# Patient Record
Sex: Male | Born: 1975 | Race: Black or African American | Hispanic: No | Marital: Married | Smoking: Never smoker
Health system: Southern US, Community
[De-identification: ages and names within clinical notes are randomized; demographics above are authoritative.]

## PROBLEM LIST (undated history)

## (undated) DIAGNOSIS — F909 Attention-deficit hyperactivity disorder, unspecified type: Secondary | ICD-10-CM

## (undated) DIAGNOSIS — R42 Dizziness and giddiness: Secondary | ICD-10-CM

## (undated) HISTORY — DX: Dizziness and giddiness: R42

## (undated) HISTORY — DX: Attention-deficit hyperactivity disorder, unspecified type: F90.9

---

## 2003-12-22 ENCOUNTER — Emergency Department (HOSPITAL_COMMUNITY): Admission: EM | Admit: 2003-12-22 | Discharge: 2003-12-22 | Payer: Self-pay | Admitting: Emergency Medicine

## 2007-09-09 ENCOUNTER — Ambulatory Visit: Payer: Self-pay | Admitting: Family Medicine

## 2007-09-09 DIAGNOSIS — H9190 Unspecified hearing loss, unspecified ear: Secondary | ICD-10-CM | POA: Insufficient documentation

## 2007-09-10 ENCOUNTER — Encounter: Payer: Self-pay | Admitting: Family Medicine

## 2007-09-10 LAB — CONVERTED CEMR LAB
ALT: 15 units/L (ref 0–53)
AST: 18 units/L (ref 0–37)
BUN: 15 mg/dL (ref 6–23)
Calcium: 9.6 mg/dL (ref 8.4–10.5)
Chloride: 104 meq/L (ref 96–112)
Cholesterol: 148 mg/dL (ref 0–200)
Creatinine, Ser: 0.96 mg/dL (ref 0.40–1.50)
HDL: 47 mg/dL (ref >39)
TSH: 1.85 microintl units/mL (ref 0.350–5.50)
Total Bilirubin: 0.6 mg/dL (ref 0.3–1.2)
Total CHOL/HDL Ratio: 3.1
VLDL: 9 mg/dL (ref 0–40)

## 2008-07-01 ENCOUNTER — Encounter: Admission: RE | Admit: 2008-07-01 | Discharge: 2008-07-01 | Payer: Self-pay | Admitting: Family Medicine

## 2008-07-01 ENCOUNTER — Ambulatory Visit: Payer: Self-pay | Admitting: Family Medicine

## 2008-07-05 ENCOUNTER — Encounter: Payer: Self-pay | Admitting: Family Medicine

## 2008-07-09 ENCOUNTER — Encounter: Payer: Self-pay | Admitting: Family Medicine

## 2008-07-09 DIAGNOSIS — F909 Attention-deficit hyperactivity disorder, unspecified type: Secondary | ICD-10-CM

## 2008-07-09 HISTORY — DX: Attention-deficit hyperactivity disorder, unspecified type: F90.9

## 2008-07-13 ENCOUNTER — Telehealth: Payer: Self-pay | Admitting: Family Medicine

## 2008-09-14 ENCOUNTER — Telehealth: Payer: Self-pay | Admitting: Family Medicine

## 2008-12-01 ENCOUNTER — Telehealth: Payer: Self-pay | Admitting: Family Medicine

## 2008-12-03 ENCOUNTER — Ambulatory Visit: Payer: Self-pay | Admitting: Family Medicine

## 2009-12-08 ENCOUNTER — Telehealth: Payer: Self-pay | Admitting: Family Medicine

## 2010-05-23 ENCOUNTER — Telehealth: Payer: Self-pay | Admitting: Family Medicine

## 2010-07-14 ENCOUNTER — Ambulatory Visit: Payer: Self-pay | Admitting: Family Medicine

## 2010-07-14 DIAGNOSIS — R42 Dizziness and giddiness: Secondary | ICD-10-CM

## 2010-07-14 HISTORY — DX: Dizziness and giddiness: R42

## 2010-11-13 LAB — CONVERTED CEMR LAB
ALT: 13 units/L (ref 0–53)
AST: 22 units/L (ref 0–37)
Creatinine, Ser: 0.93 mg/dL (ref 0.40–1.50)
HCT: 44.9 % (ref 39.0–52.0)
LDL Cholesterol: 102 mg/dL — ABNORMAL HIGH (ref 0–99)
MCV: 87.2 fL (ref 78.0–100.0)
Platelets: 254 10*3/uL (ref 150–400)
RDW: 14.7 % (ref 11.5–15.5)
Sodium: 138 meq/L (ref 135–145)
TSH: 1.527 microintl units/mL (ref 0.350–4.500)
Total Bilirubin: 1.3 mg/dL — ABNORMAL HIGH (ref 0.3–1.2)
Total CHOL/HDL Ratio: 3.5
Total Protein: 7.1 g/dL (ref 6.0–8.3)
VLDL: 9 mg/dL (ref 0–40)

## 2010-11-15 NOTE — Progress Notes (Signed)
Summary: Aderral refill  Phone Note Refill Request Message from:  Patient on December 08, 2009 1:20 PM  Refills Requested: Medication #1:  ADDERALL XR 15 MG XR24H-CAP Take 1 tablet by mouth once a day 30.   Supply Requested: 1 month  Method Requested: Pick up at Office Initial call taken by: Kathlene November,  December 08, 2009 1:20 PM    Prescriptions: ADDERALL XR 15 MG XR24H-CAP (AMPHETAMINE-DEXTROAMPHETAMINE) Take 1 tablet by mouth once a day 30  #90 x 0   Entered and Authorized by:   Nani Gasser MD   Signed by:   Nani Gasser MD on 12/08/2009   Method used:   Print then Give to Patient   RxID:   [id number]

## 2010-11-15 NOTE — Progress Notes (Signed)
Summary: refill  Phone Note Refill Request Message from:  Patient on May 23, 2010 11:15 AM  Refills Requested: Medication #1:  ADDERALL XR 15 MG XR24H-CAP Take 1 tablet by mouth once a day 30.   Supply Requested: 1 month call [phone] when ready   Method Requested: Pick up at Office Initial call taken by: Kathlene November,  May 23, 2010 11:15 AM    Prescriptions: ADDERALL XR 15 MG XR24H-CAP (AMPHETAMINE-DEXTROAMPHETAMINE) Take 1 tablet by mouth once a day 30  #90 x 0   Entered and Authorized by:   Nani Gasser MD   Signed by:   Nani Gasser MD on 05/23/2010   Method used:   Print then Give to Patient   RxID:   [id number]

## 2010-11-15 NOTE — Assessment & Plan Note (Signed)
Summary: CPE   Vital Signs:  Patient profile:   35 year old male Height:      72.0 inches Weight:      204 pounds BMI:     27.77 Pulse rate:   63 / minute BP sitting:   114 / 68  (right arm) Cuff size:   large  Vitals Entered By: Avon Gully CMA, Duncan Dull) (July 14, 2010 8:27 AM) CC: CPE   Primary Care Provider:  Linford Arnold, C  CC:  CPE.  History of Present Illness: Occ dizzy spell for about 5 min. Will feel weak with it. No aggrevating factors.  Can happen sitting.  No vision or hearing changes with it. Started about 2-3 months ago.  Has been working much longer hours at work in the heat.  Feel staying hydrated. Getting 6-7 hours a night. No palps or CP with the episdoes.  No aggrevating sxs. Describes as more pre-syncopal episodes. No true syncope. Happens about once a week. No known triggers.  They havent change in frequency or duration or intensity.    Current Medications (verified): 1)  Adderall Xr 15 Mg Xr24h-Cap (Amphetamine-Dextroamphetamine) .... Take 1 Tablet By Mouth Once A Day 30  Allergies (verified): No Known Drug Allergies  Comments:  Nurse/Medical Assistant: The patient's medications and allergies were reviewed with the patient and were updated in the Medication and Allergy Lists. 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 Married to [employer] with 3 children.  Luanna Salk his oldest is also a pt here.  Never Smoked Alcohol use-yes Drug use-no Regular exercise-yes  Review of Systems  The patient denies anorexia, fever, weight loss, weight gain, vision loss, decreased hearing, hoarseness, chest pain, syncope, dyspnea on  exertion, peripheral edema, prolonged cough, headaches, hemoptysis, abdominal pain, melena, hematochezia, severe indigestion/heartburn, hematuria, incontinence, genital sores, muscle weakness, suspicious skin lesions, transient blindness, difficulty walking, depression, unusual weight change, abnormal bleeding, and enlarged lymph nodes.    Physical Exam  General:  Well-developed,well-nourished,in no acute distress; alert,appropriate and cooperative throughout examination Head:  Normocephalic and atraumatic without obvious abnormalities. No apparent alopecia or balding. Eyes:  No corneal or conjunctival inflammation noted. EOMI. Perrla.  Ears:  External ear exam shows no significant lesions or deformities.  Otoscopic examination reveals clear canals, tympanic membranes are intact bilaterally without bulging, retraction, inflammation or discharge. Hearing is grossly normal bilaterally. Nose:  External nasal examination shows no deformity or inflammation.  Mouth:  Oral mucosa and oropharynx without lesions or exudates.  Teeth in good repair. Neck:  No deformities, masses, or tenderness noted. Chest Wall:  No deformities, masses, tenderness or gynecomastia noted. Lungs:  Normal respiratory effort, chest expands symmetrically. Lungs are clear to auscultation, no crackles or wheezes. Heart:  Normal rate and regular rhythm. S1 and S2 normal without gallop, murmur, click, rub or other extra sounds. Abdomen:  Bowel sounds positive,abdomen soft and non-tender without masses, organomegaly or hernias noted. Msk:  No deformity or scoliosis noted of thoracic or lumbar spine.   Pulses:  R and L carotid,radial,dorsalis pedis and posterior tibial pulses are full and equal bilaterally Extremities:  No clubbing, cyanosis, edema, or deformity noted with normal full range of motion of  all joints.   Neurologic:  No cranial nerve deficits noted. Station and gait are normal. DTRs are symmetrical throughout. Sensory, motor  and coordinative functions appear intact. Skin:  no rashes.   Cervical Nodes:  No lymphadenopathy noted Psych:  Cognition and judgment appear intact. Alert and cooperative with normal attention span and concentration. No apparent delusions, illusions, hallucinations   Impression & Recommendations:  Problem # 1:  PREVENTIVE HEALTH CARE (ICD-V70.0)  Exam is normal today Keep up the exercise Work on improving sleep duration Make sure eating healthy diet. We will call you next week with lab results.   Orders: T-Comprehensive Metabolic Panel [phone]) T-Lipid Profile [phone])  Problem # 2:  DIZZINESS (ICD-780.4)  Discussed no red flag sxs. No CP, SOB, or other neurological sxs. May just be a combo of working long hours, feeling stressed at work, and not getting enough sleep.  EKG today shows rate of 61 bpm, no acute changes, NSR.   Orders: EKG w/ Interpretation (93000) T-TSH ([id number]) T-CBC No Diff ([id number])  Complete Medication List: 1)  Adderall Xr 15 Mg Xr24h-cap (Amphetamine-dextroamphetamine) .... Take 1 tablet by mouth once a day 30  Other Orders: Flu Vaccine 4yrs + (44010) Admin 1st Vaccine (27253)  Patient Instructions: 1)  Exam is normal today 2)  Keep up the exercise 3)  Work on improving sleep duration 4)  Make sure eating healthy diet. 5)  We will call you next week with lab results.  6)  You were given a flu shot today.    Immunizations Administered:  Influenza Vaccine # 1:    Vaccine Type: Fluvax 3+    Site: left deltoid    Mfr: GlaxoSmithKline    Dose: 0.5 ml    Route: IM    Given by: Sue Lush McCrimmon CMA, (AAMA)    Exp. Date: 04/15/2011    Lot #: [id number]    VIS given: 05/10/10 version given July 14, 2010.  Flu Vaccine Consent Questions:    Do you have a history of severe allergic reactions to this vaccine? no    Any prior history of allergic reactions to egg and/or gelatin? no    Do you have a sensitivity to the  preservative Thimersol? no    Do you have a past history of Guillan-Barre Syndrome? no    Do you currently have an acute febrile illness? no    Have you ever had a severe reaction to latex? no    Vaccine information given and explained to patient? no

## 2010-12-25 ENCOUNTER — Encounter: Payer: Self-pay | Admitting: Emergency Medicine

## 2010-12-25 ENCOUNTER — Ambulatory Visit (INDEPENDENT_AMBULATORY_CARE_PROVIDER_SITE_OTHER): Payer: BC Managed Care – PPO | Admitting: Emergency Medicine

## 2010-12-25 DIAGNOSIS — H1130 Conjunctival hemorrhage, unspecified eye: Secondary | ICD-10-CM

## 2010-12-26 ENCOUNTER — Encounter: Payer: Self-pay | Admitting: Emergency Medicine

## 2011-01-03 NOTE — Progress Notes (Signed)
Summary: OFFICE NOTE  OFFICE NOTE   Imported By: Tawni Carnes 12/26/2010 15:41:56  _____________________________________________________________________  External Attachment:    Type:   Image     Comment:   External Document

## 2011-01-03 NOTE — Assessment & Plan Note (Signed)
Summary: RT EYE REDNESS/WSE    Current Allergies: No known allergies   The patient and/or caregiver has been counseled thoroughly with regard to medications prescribed including dosage, schedule, interactions, rationale for use, and possible side effects and they verbalize understanding.  Diagnoses and expected course of recovery discussed and will return if not improved as expected or if the condition worsens. Patient and/or caregiver verbalized understanding.

## 2011-01-03 NOTE — Medication Information (Signed)
Summary: PRESCRIPTION  PRESCRIPTION   Imported By: Tawni Carnes 12/26/2010 15:42:26  _____________________________________________________________________  External Attachment:    Type:   Image     Comment:   External Document

## 2011-06-15 ENCOUNTER — Inpatient Hospital Stay (INDEPENDENT_AMBULATORY_CARE_PROVIDER_SITE_OTHER)
Admission: RE | Admit: 2011-06-15 | Discharge: 2011-06-15 | Disposition: A | Discharge status: Home / Self Care | Payer: BC Managed Care – PPO | Source: Ambulatory Visit | Attending: Emergency Medicine | Admitting: Emergency Medicine

## 2011-06-15 ENCOUNTER — Encounter: Payer: Self-pay | Admitting: Emergency Medicine

## 2011-06-15 DIAGNOSIS — Z113 Encounter for screening for infections with a predominantly sexual mode of transmission: Secondary | ICD-10-CM

## 2011-06-18 ENCOUNTER — Telehealth (INDEPENDENT_AMBULATORY_CARE_PROVIDER_SITE_OTHER): Payer: Self-pay | Admitting: Emergency Medicine

## 2011-06-19 ENCOUNTER — Telehealth (INDEPENDENT_AMBULATORY_CARE_PROVIDER_SITE_OTHER): Payer: Self-pay | Admitting: Emergency Medicine

## 2011-09-18 NOTE — Telephone Encounter (Signed)
  Phone Note Outgoing Call Call back at [facility] Phone [phone]   Call placed by: Lavell Islam RN,  June 19, 2011 2:23 PM Call placed to: Patient Summary of Call: Second call with no answer. Will await patient call. Initial call taken by: Lavell Islam RN,  June 19, 2011 2:51 PM

## 2011-09-18 NOTE — Telephone Encounter (Signed)
  Phone Note Outgoing Call Call back at Home Phone [phone] P [facility]     Call placed by: Lavell Islam RN,  June 18, 2011 4:42 PM Call placed to: Patient Summary of Call: Left message on voice mail to call for test results. Initial call taken by: Lavell Islam RN,  June 18, 2011 4:42 PM

## 2011-09-18 NOTE — Telephone Encounter (Signed)
  Phone Note Call from Patient   Caller: Patient Summary of Call: Pt. called, gave appropriate password; given results of all cultures. States he is doing well. Initial call taken by: Lavell Islam RN,  June 19, 2011 4:48 PM

## 2011-09-18 NOTE — Progress Notes (Signed)
Summary: STD TESTING (rm 5)   Vital Signs:  Patient Profile:   35 Years Old Male CC:      STD testing Height:     72.0 inches Weight:      224 pounds O2 Sat:      100 % O2 treatment:    Room Air Temp:     98.4 degrees F oral Pulse rate:   52 / minute Resp:     14 per minute BP sitting:   118 / 72  (left arm) Cuff size:   large  Pt. in pain?   no  Vitals Entered By: Lajean Saver RN (June 15, 2011 6:01 PM)                   Updated Prior Medication List: No Medications Current Allergies: No known allergies History of Present Illness History from: patient Chief Complaint: STD testing History of Present Illness: 35 yo married monogomous male requests routine std testing. Denies any current risk factors. No known exposures. Lasts std tests neg approx 12 yrs ago. Asymptomatic. pmh neg.  REVIEW OF SYSTEMS Constitutional Symptoms      Denies fever, chills, night sweats, weight loss, weight gain, and fatigue.  Eyes       Denies change in vision, eye pain, eye discharge, glasses, contact lenses, and eye surgery. Ear/Nose/Throat/Mouth       Denies hearing loss/aids, change in hearing, ear pain, ear discharge, dizziness, frequent runny nose, frequent nose bleeds, sinus problems, sore throat, hoarseness, and tooth pain or bleeding.  Respiratory       Denies dry cough, productive cough, wheezing, shortness of breath, asthma, bronchitis, and emphysema/COPD.  Cardiovascular       Denies murmurs, chest pain, and tires easily with exhertion.    Gastrointestinal       Denies stomach pain, nausea/vomiting, diarrhea, constipation, blood in bowel movements, and indigestion. Genitourniary       Denies painful urination, kidney stones, and loss of urinary control. Neurological       Denies paralysis, seizures, and fainting/blackouts. Musculoskeletal       Denies muscle pain, joint pain, joint stiffness, decreased range of motion, redness, swelling, muscle weakness, and gout.   Skin       Denies bruising, unusual mles/lumps or sores, and hair/skin or nail changes.  Psych       Denies mood changes, temper/anger issues, anxiety/stress, speech problems, depression, and sleep problems.  Past History:  Past Medical History: Reviewed history from 07/09/2008 and no changes required. Formal Eval by Frances Furbish, PH.D and dx with ADHD 06/2008  Past Surgical History: Reviewed history from 09/09/2007 and no changes required. None  Family History: Reviewed history from 09/09/2007 and no changes required. Aunt and Uncle with alcoholism GF with Colon and Prostate Ca GM with DM, Hi cholesterol, HTN Mother, Aunts , MGM with hearing loss  Social History: Reviewed history from 07/14/2010 and no changes required. 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  Luanna Salk his oldest is also a pt here.  Never Smoked Alcohol use-yes Drug use-no Regular exercise-yes Physical Exam General appearance: well developed, well nourished, no acute distress GU: pt declined exam   we discussed, q and a for 15 min. Assessment New Problems: SCREENING EXAMINATION FOR VENEREAL DISEASE (ICD-V74.5)   Plan New Orders: T-Chlamydia  Probe, urine [phone] T-HIV Antibody  (Reflex) [[id number]] T-GC Probe, urine [phone] T-Syphilis Test (RPR) [[id number]] Est. Patient Level III [28413]  Planning Comments:   Discussed. q and a's. Tests ordered at his request.  Follow Up: Follow up with Primary Physician  The patient and/or caregiver has been counseled thoroughly with regard to medications prescribed including dosage, schedule, interactions, rationale for use, and possible side effects and they verbalize understanding.  Diagnoses and expected course of recovery discussed and will return if not improved as expected or if the condition worsens. Patient and/or caregiver verbalized understanding.   Orders Added: 1)  T-Chlamydia   Probe, urine [id number] 2)  T-HIV Antibody  (Reflex) [[id number]] 3)  T-GC Probe, urine [phone] 4)  T-Syphilis Test (RPR) [[id number]] 5)  Est. Patient Level III [10272]

## 2011-09-20 ENCOUNTER — Encounter: Payer: Self-pay | Admitting: *Deleted

## 2011-09-20 ENCOUNTER — Emergency Department
Admission: EM | Admit: 2011-09-20 | Discharge: 2011-09-20 | Disposition: A | Discharge status: Home / Self Care | Payer: BC Managed Care – PPO | Source: Home / Self Care

## 2011-09-20 DIAGNOSIS — Z23 Encounter for immunization: Secondary | ICD-10-CM

## 2011-09-20 MED ORDER — INFLUENZA VAC TYP A&B SURF ANT IM INJ
0.5000 mL | INJECTION | Freq: Once | INTRAMUSCULAR | Status: AC
  Administered 2011-09-20: 0.5 mL via INTRAMUSCULAR

## 2011-09-20 NOTE — ED Notes (Signed)
The pt is here today for a flu vaccine.  

## 2011-10-24 ENCOUNTER — Other Ambulatory Visit: Payer: Self-pay | Admitting: *Deleted

## 2011-10-24 NOTE — Telephone Encounter (Signed)
Needs appt

## 2011-10-24 NOTE — Telephone Encounter (Signed)
Pt requesting a refill on his generic Aderrall 15mg . Hasn't been seen in over a year. No record of med in new or old system.

## 2011-10-24 NOTE — Telephone Encounter (Signed)
Pt informed he needs to make appt.  MA, LPN

## 2011-11-02 ENCOUNTER — Ambulatory Visit: Payer: BC Managed Care – PPO | Admitting: Family Medicine

## 2011-11-02 DIAGNOSIS — Z0289 Encounter for other administrative examinations: Secondary | ICD-10-CM

## 2012-10-19 ENCOUNTER — Emergency Department (INDEPENDENT_AMBULATORY_CARE_PROVIDER_SITE_OTHER)
Admission: EM | Admit: 2012-10-19 | Discharge: 2012-10-19 | Disposition: A | Discharge status: Home / Self Care | Payer: BC Managed Care – PPO | Source: Home / Self Care

## 2012-10-19 ENCOUNTER — Encounter: Payer: Self-pay | Admitting: Emergency Medicine

## 2012-10-19 DIAGNOSIS — Z23 Encounter for immunization: Secondary | ICD-10-CM

## 2012-10-19 MED ORDER — INFLUENZA VAC TYP A&B SURF ANT IM INJ
0.5000 mL | INJECTION | Freq: Once | INTRAMUSCULAR | Status: AC
  Administered 2012-10-19: 0.5 mL via INTRAMUSCULAR

## 2012-10-19 NOTE — ED Notes (Signed)
Influenza immunization desired.

## 2013-01-27 ENCOUNTER — Encounter: Payer: Self-pay | Admitting: Family Medicine

## 2013-01-27 ENCOUNTER — Ambulatory Visit (INDEPENDENT_AMBULATORY_CARE_PROVIDER_SITE_OTHER): Payer: BC Managed Care – PPO | Admitting: Family Medicine

## 2013-01-27 ENCOUNTER — Ambulatory Visit (INDEPENDENT_AMBULATORY_CARE_PROVIDER_SITE_OTHER): Payer: BC Managed Care – PPO

## 2013-01-27 VITALS — BP 115/62 | HR 72 | Wt 227.0 lb

## 2013-01-27 DIAGNOSIS — M25539 Pain in unspecified wrist: Secondary | ICD-10-CM

## 2013-01-27 DIAGNOSIS — M25531 Pain in right wrist: Secondary | ICD-10-CM

## 2013-01-27 NOTE — Progress Notes (Signed)
CC: Ryan Gregory is a 37 y.o. male is here for right wrist pain   Subjective: HPI:  Patient complaint of right wrist pain. This has been present for 2 months. It came on suddenly without any precipitating event. It is present on a daily basis. Absolutely no pain with rest. He describes a sharp stinging sensation deep within the wrist with resisted flexion or extension. Most notable when bowling or picking up heavy objects. Pain starts immediately with these motions, disappears immediately with cessation of these motions. He denies overlying swelling nor redness of the joint. He denies radiation of pain. He had identical pain years ago that went away with intermittent bracing. No recent trauma but did have a crush injury about a decade ago.  Has tried ibuprofen without much change and presentation of pain. Nothing else makes better or worse. Pain is mild to moderate when present. Denies fevers, chills, joint pain elsewhere, muscle atrophy, weakness, tingling or numbness of the affected extremity   Review Of Systems Outlined In HPI  Past Medical History  Diagnosis Date  . DIZZINESS 07/14/2010    Qualifier: Diagnosis of  By: Linford Arnold MD, Santina Evans    . ADHD 07/09/2008    Qualifier: Diagnosis of  By: Linford Arnold MD, Santina Evans       No family history on file.   History  Substance Use Topics  . Smoking status: Never Smoker   . Smokeless tobacco: Not on file  . Alcohol Use: No     Objective: Filed Vitals:   01/27/13 1532  BP: 115/62  Pulse: 72    Vital signs reviewed. General: Alert and Oriented, No Acute Distress HEENT: Pupils equal, round, reactive to light. Conjunctivae clear.  External ears unremarkable.  Moist mucous membranes. Lungs: Clear and comfortable work of breathing, speaking in full sentences without accessory muscle use. Cardiac: Regular rate and rhythm.  Neuro: CN II-XII grossly intact, gait normal. Extremities: No peripheral edema.  Strong peripheral pulses. Right  wrist: Full range of motion and strength of the right wrist and distal appendages. No pain in anatomical snuff box, negative Finklestein's. No pain with ulnar/radial compression. Pain is reproduced with resisted wrist flexion and somewhat with resisted wrist extension. No pain with direct palpation. No pain with resisted lateral or ulnar deviation, no pain with passive range of motion testing. Negative Finklestein's Mental Status: No depression, anxiety, nor agitation. Logical though process. Skin: Warm and dry.  Assessment & Plan: [name] was seen today for right wrist pain.  Diagnoses and associated orders for this visit:  Right wrist pain - DG Wrist Complete Right; Future    Will start with x-ray of the wrist to look for articular damage or abnormality. We'll discuss x-ray with Dr. Karie Schwalbe. in sports medicine to determine candidacy for steroid injections if applicable. We'll update patient with plan tomorrow after reviewing x-rays.   Return in about 4 weeks (around 02/24/2013).

## 2013-01-28 ENCOUNTER — Telehealth: Payer: Self-pay | Admitting: *Deleted

## 2013-01-28 NOTE — Telephone Encounter (Signed)
Pt called & asked about xray results.  I notified him of the results as well as to f/u with Dr. Karie Schwalbe if not improving.

## 2013-04-24 ENCOUNTER — Encounter: Payer: Self-pay | Admitting: Family Medicine

## 2013-04-24 ENCOUNTER — Ambulatory Visit (INDEPENDENT_AMBULATORY_CARE_PROVIDER_SITE_OTHER): Payer: BC Managed Care – PPO | Admitting: Family Medicine

## 2013-04-24 VITALS — BP 124/70 | HR 62 | Ht 72.0 in | Wt 223.0 lb

## 2013-04-24 DIAGNOSIS — Z3009 Encounter for other general counseling and advice on contraception: Secondary | ICD-10-CM

## 2013-04-24 NOTE — Progress Notes (Signed)
  Subjective:    Patient ID: Ryan Gregory, male    DOB: Aug 31, 1976, 37 y.o.   MRN: [id number]  HPI Here to discuss vasectomy. He is interested and has been thinking about it for the last year. He does have some questions today and wants to know who I recommend for referral.   Review of Systems     Objective:   Physical Exam  Constitutional: He appears well-developed and well-nourished.  HENT:  Head: Normocephalic and atraumatic.  Skin: Skin is warm and dry.  Psychiatric: He has a normal mood and affect. His behavior is normal.          Assessment & Plan:  Vasectomy - discussed potential procedures and recovery time. Recommend referral to Dr. Barron Alvine at a [facility] in [place]. Given name and information and referral placed. Call if any palms. Time spent 15 minutes, greater than 50% time spent counseling about vasectomy.

## 2013-11-08 IMAGING — CR DG WRIST COMPLETE 3+V*R*
2 series · 2 of 2 positions shown · non-contrast
Comparison: December 22, 2003

CLINICAL DATA: Pain

RIGHT WRIST - COMPLETE 3+ VIEW

[view not recorded (1 of 2)]
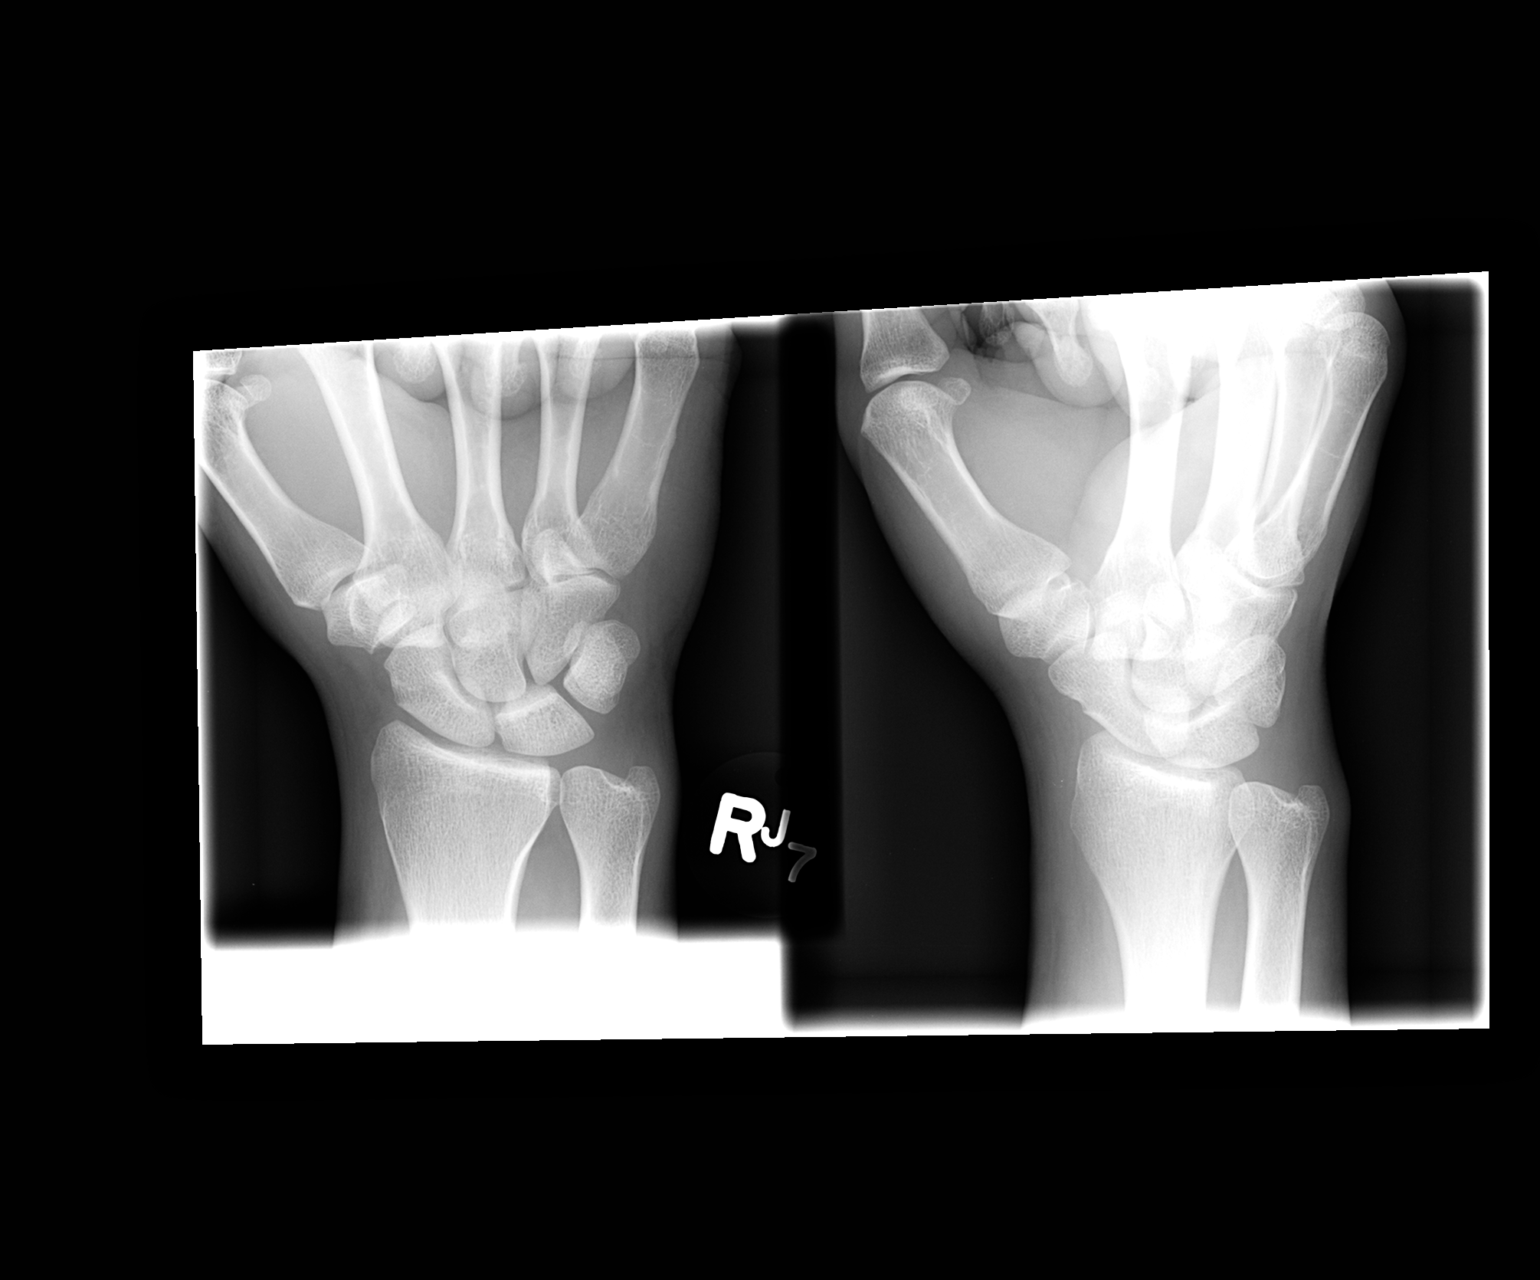

[view not recorded (2 of 2)]
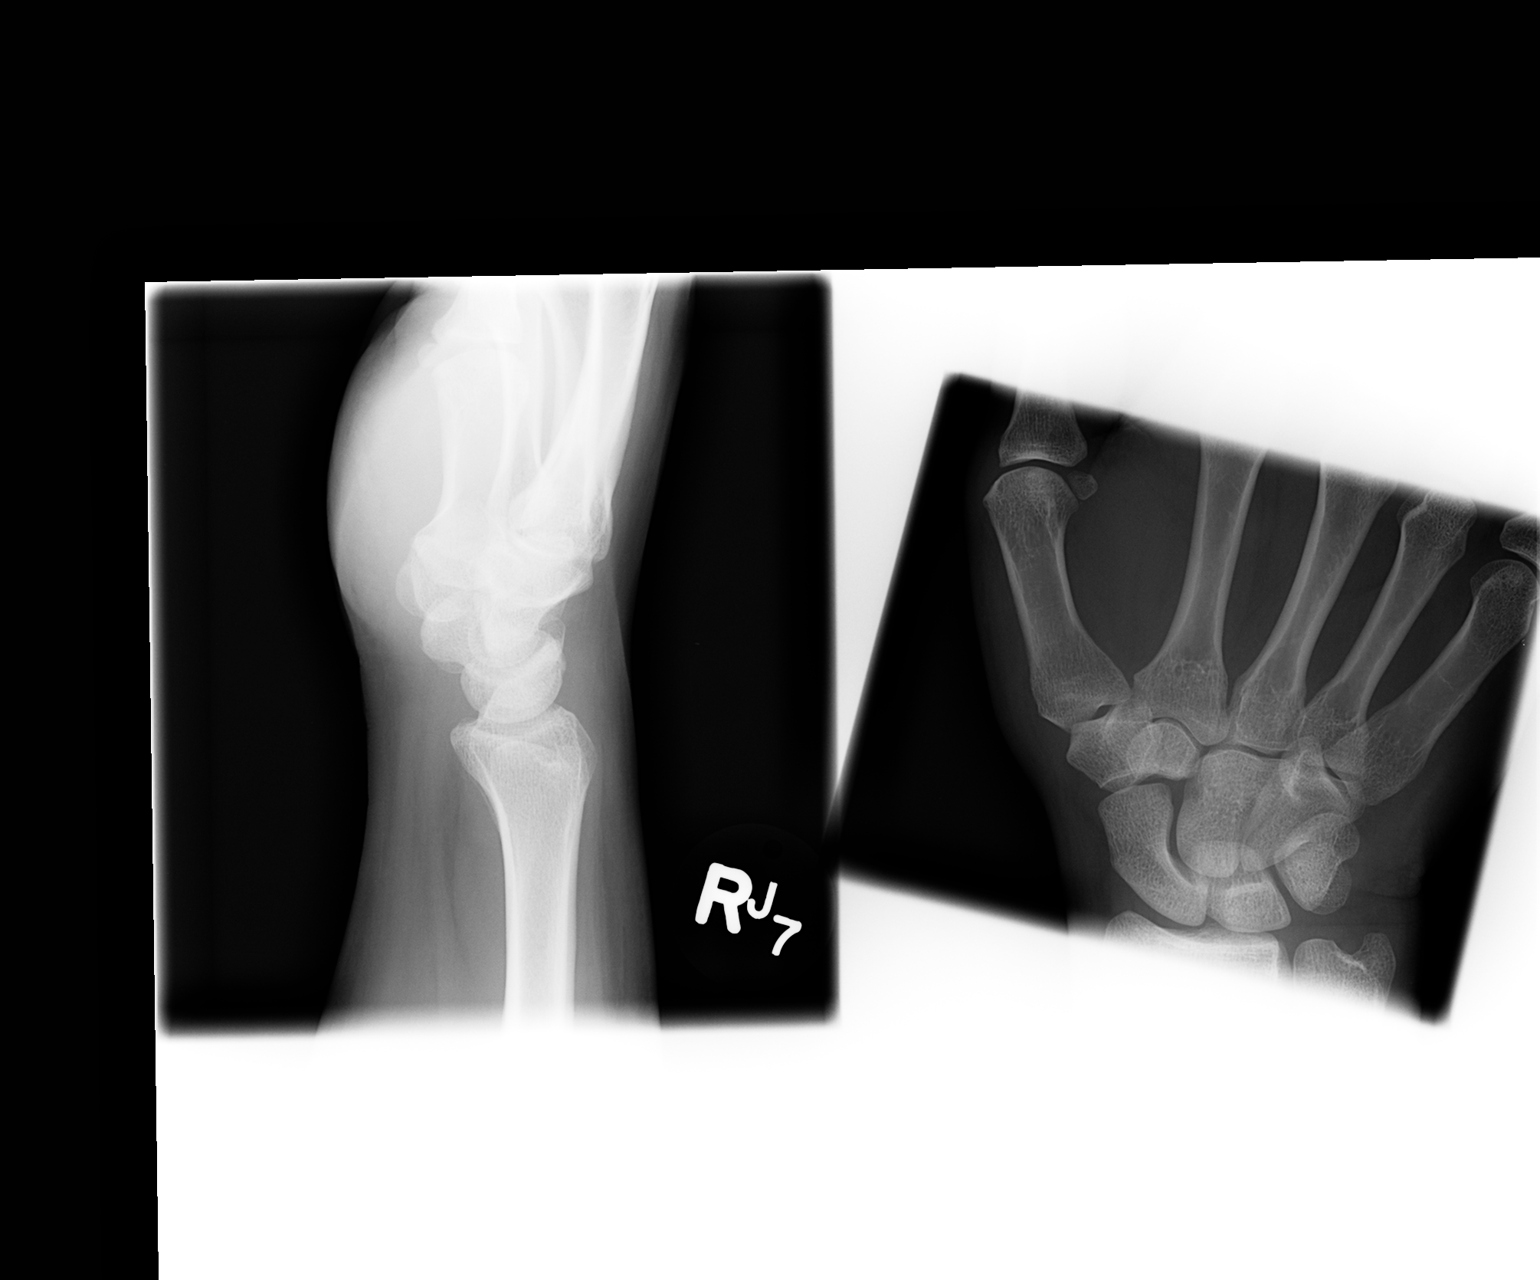

[2 of 2 positions shown; findings below may reference images not displayed]

FINDINGS: There is no fracture or dislocation.  Joint spaces appear
intact.  No erosive change.
IMPRESSION: No abnormality noted.

## 2014-01-20 ENCOUNTER — Encounter: Payer: Self-pay | Admitting: Family Medicine

## 2014-01-20 ENCOUNTER — Ambulatory Visit (INDEPENDENT_AMBULATORY_CARE_PROVIDER_SITE_OTHER): Payer: BC Managed Care – PPO | Admitting: Family Medicine

## 2014-01-20 VITALS — BP 126/73 | HR 55 | Ht 72.0 in | Wt 221.0 lb

## 2014-01-20 DIAGNOSIS — F909 Attention-deficit hyperactivity disorder, unspecified type: Secondary | ICD-10-CM

## 2014-01-20 MED ORDER — AMPHETAMINE-DEXTROAMPHET ER 10 MG PO CP24: 10.0000 mg | ORAL_CAPSULE | Freq: Every day | ORAL | Status: DC

## 2014-01-20 NOTE — Progress Notes (Signed)
   Subjective:    Patient ID: Ryan Gregory, male    DOB: 08/24/1976, 38 y.o.   MRN: [id number]  HPI  Interestered in getting back on ADHD medication. Originally was rx by [facility] in 2009/10.  Was on Adderall XR a that time.  Maybe 10mg .  fingind more forgetful.  Starting back to school this summer. No recent CP or SOB. No heart problems.  He did have some problems with not eating much and feeling dizzy when he took it before and says he'll try to be very diligent about eating regularly on the medication.  Review of Systems     Objective:   Physical Exam  Constitutional: He is oriented to person, place, and time. He appears well-developed and well-nourished.  HENT:  Head: Normocephalic and atraumatic.  Cardiovascular: Normal rate, regular rhythm and normal heart sounds.   Pulmonary/Chest: Effort normal and breath sounds normal.  Neurological: He is alert and oriented to person, place, and time.  Skin: Skin is warm and dry.  Psychiatric: He has a normal mood and affect. His behavior is normal.          Assessment & Plan:  ADHD - Will restart medication. F/U in 2 months.  Warned about potential side effects of stimulants. Monitor for any chest pain shortness of breath. We will need to monitor blood pressure and pulse rate carefully. If he is noticing any symptoms then please call us.

## 2014-03-23 ENCOUNTER — Ambulatory Visit: Payer: BC Managed Care – PPO | Admitting: Family Medicine

## 2014-03-27 ENCOUNTER — Ambulatory Visit (INDEPENDENT_AMBULATORY_CARE_PROVIDER_SITE_OTHER): Payer: BC Managed Care – PPO | Admitting: Family Medicine

## 2014-03-27 ENCOUNTER — Encounter: Payer: Self-pay | Admitting: Family Medicine

## 2014-03-27 VITALS — BP 114/60 | HR 63 | Ht 72.0 in | Wt 214.0 lb

## 2014-03-27 DIAGNOSIS — Z23 Encounter for immunization: Secondary | ICD-10-CM

## 2014-03-27 DIAGNOSIS — F909 Attention-deficit hyperactivity disorder, unspecified type: Secondary | ICD-10-CM

## 2014-03-27 MED ORDER — AMPHETAMINE-DEXTROAMPHET ER 10 MG PO CP24: 10.0000 mg | ORAL_CAPSULE | Freq: Every day | ORAL | Status: DC

## 2014-03-27 NOTE — Progress Notes (Signed)
   Subjective:    Patient ID: Ryan Gregory, male    DOB: 07/01/1976, 38 y.o.   MRN: [id number]  HPI F/U ADHD - he restarted Adderall about 2 months ago. He been off of it for several years. Her prior diagnosis. We restarted him at 10 mg which was the dose he was previously taking. His been happy with his current regimen. No chest pain, short of breath, palpitations. He's not skipping any meals and is not keeping him awake at night. He has lost some weight but he says is intentional. He's been trying to lose weight and would like to lose about 10 more pounds.   Review of Systems     Objective:   Physical Exam  Constitutional: He is oriented to person, place, and time. He appears well-developed and well-nourished.  HENT:  Head: Normocephalic and atraumatic.  Cardiovascular: Normal rate, regular rhythm and normal heart sounds.   Pulmonary/Chest: Effort normal and breath sounds normal.  Neurological: He is alert and oriented to person, place, and time.  Skin: Skin is warm and dry.  Psychiatric: He has a normal mood and affect. His behavior is normal.          Assessment & Plan:  ADHD - well-controlled on current regimen. He is happy with the current plan. We've offered 3 months. I will see him back in 4 months. Blood pressure is well-controlled. No side effects of the medication.

## 2014-07-27 ENCOUNTER — Ambulatory Visit: Payer: BC Managed Care – PPO | Admitting: Family Medicine

## 2014-10-14 ENCOUNTER — Encounter: Payer: Self-pay | Admitting: Family Medicine

## 2014-10-14 ENCOUNTER — Ambulatory Visit (INDEPENDENT_AMBULATORY_CARE_PROVIDER_SITE_OTHER): Payer: BC Managed Care – PPO | Admitting: *Deleted

## 2014-10-14 ENCOUNTER — Ambulatory Visit (INDEPENDENT_AMBULATORY_CARE_PROVIDER_SITE_OTHER): Payer: BC Managed Care – PPO | Admitting: Family Medicine

## 2014-10-14 VITALS — BP 122/69 | HR 68 | Temp 98.2°F | Ht 72.0 in | Wt 209.0 lb

## 2014-10-14 DIAGNOSIS — F902 Attention-deficit hyperactivity disorder, combined type: Secondary | ICD-10-CM | POA: Diagnosis not present

## 2014-10-14 DIAGNOSIS — Z0000 Encounter for general adult medical examination without abnormal findings: Secondary | ICD-10-CM

## 2014-10-14 DIAGNOSIS — Z23 Encounter for immunization: Secondary | ICD-10-CM | POA: Diagnosis not present

## 2014-10-14 DIAGNOSIS — Z0189 Encounter for other specified special examinations: Secondary | ICD-10-CM

## 2014-10-14 LAB — COMPLETE METABOLIC PANEL WITH GFR
ALBUMIN: 4 g/dL (ref 3.5–5.2)
ALT: 19 U/L (ref 0–53)
AST: 40 U/L — AB (ref 0–37)
Alkaline Phosphatase: 45 U/L (ref 39–117)
BUN: 17 mg/dL (ref 6–23)
CALCIUM: 9.4 mg/dL (ref 8.4–10.5)
CHLORIDE: 106 meq/L (ref 96–112)
CO2: 27 mEq/L (ref 19–32)
Creat: 0.93 mg/dL (ref 0.50–1.35)
GFR, Est African American: >89 mL/min
GFR, Est Non African American: >89 mL/min
Glucose, Bld: 89 mg/dL (ref 70–99)
POTASSIUM: 4.5 meq/L (ref 3.5–5.3)
SODIUM: 139 meq/L (ref 135–145)
TOTAL PROTEIN: 6.8 g/dL (ref 6.0–8.3)
Total Bilirubin: 0.8 mg/dL (ref 0.2–1.2)

## 2014-10-14 LAB — LIPID PANEL
Cholesterol: 162 mg/dL (ref 0–200)
HDL: 46 mg/dL (ref >39)
LDL CALC: 104 mg/dL — AB (ref 0–99)
TRIGLYCERIDES: 60 mg/dL (ref <150)
Total CHOL/HDL Ratio: 3.5 Ratio
VLDL: 12 mg/dL (ref 0–40)

## 2014-10-14 MED ORDER — AMPHETAMINE-DEXTROAMPHET ER 10 MG PO CP24: 10.0000 mg | ORAL_CAPSULE | Freq: Every day | ORAL | Status: DC

## 2014-10-14 NOTE — Progress Notes (Signed)
   Subjective:    Patient ID: Ryan Gregory, male    DOB: 09/16/1976, 38 y.o.   MRN: [id number]  [facility] well on Adderall 10mg .  No CP or SOB.  Not keeping him awake at night.  No palpitations. No hear conditions.    He would like to schedule a physical soon.   Review of Systems     Objective:   Physical Exam  Constitutional: He is oriented to person, place, and time. He appears well-developed and well-nourished.  HENT:  Head: Normocephalic and atraumatic.  Cardiovascular: Normal rate, regular rhythm and normal heart sounds.   Pulmonary/Chest: Effort normal and breath sounds normal.  Neurological: He is alert and oriented to person, place, and time.  Skin: Skin is warm and dry.  Psychiatric: He has a normal mood and affect. His behavior is normal.          Assessment & Plan:  ADHD - well controlled on current regimen. BP well controlled. Asymptomatic on stimulation. F/U in 4 months. 90 days of rx given.   Given labslip for CPE. Encouraged to schedule soon.   Flu vaccine given.

## 2014-10-15 LAB — HIV ANTIBODY (ROUTINE TESTING W REFLEX): HIV 1&2 Ab, 4th Generation: NONREACTIVE

## 2016-07-21 ENCOUNTER — Ambulatory Visit: Payer: Self-pay | Admitting: Family Medicine

## 2016-07-21 ENCOUNTER — Emergency Department (INDEPENDENT_AMBULATORY_CARE_PROVIDER_SITE_OTHER)
Admission: EM | Admit: 2016-07-21 | Discharge: 2016-07-21 | Disposition: A | Discharge status: Home / Self Care | Payer: BLUE CROSS/BLUE SHIELD | Source: Home / Self Care | Attending: Family Medicine | Admitting: Family Medicine

## 2016-07-21 DIAGNOSIS — B9789 Other viral agents as the cause of diseases classified elsewhere: Secondary | ICD-10-CM | POA: Diagnosis not present

## 2016-07-21 DIAGNOSIS — J069 Acute upper respiratory infection, unspecified: Secondary | ICD-10-CM

## 2016-07-21 MED ORDER — AZITHROMYCIN 250 MG PO TABS: 250.0000 mg | ORAL_TABLET | Freq: Every day | ORAL | 0 refills | Status: DC

## 2016-07-21 MED ORDER — BENZONATATE 100 MG PO CAPS: 100.0000 mg | ORAL_CAPSULE | Freq: Three times a day (TID) | ORAL | 0 refills | Status: DC

## 2016-07-21 MED ORDER — PSEUDOEPHEDRINE HCL 60 MG PO TABS: 60.0000 mg | ORAL_TABLET | Freq: Four times a day (QID) | ORAL | 0 refills | Status: DC | PRN

## 2016-07-21 NOTE — Discharge Instructions (Signed)
You may take 400-600mg Ibuprofen (Motrin) every 6-8 hours for fever and pain  °Alternate with Tylenol  °You may take 500mg Tylenol every 4-6 hours as needed for fever and pain  °Follow-up with your primary care provider next week for recheck of symptoms if not improving.  °Be sure to drink plenty of fluids and rest, at least 8hrs of sleep a night, preferably more while you are sick. °Return urgent care or go to closest ER if you cannot keep down fluids/signs of dehydration, fever not reducing with Tylenol, difficulty breathing/wheezing, stiff neck, worsening condition, or other concerns (see below)  ° °Your symptoms are likely due to a virus such as the common cold, however, if you developing worsening chest congestion with shortness of breath, persistent fever for 3 days, or symptoms not improving in 4-5 days, you may fill the antibiotic (azithromycin).  If you do fill the antibiotic,  please take antibiotics as prescribed and be sure to complete entire course even if you start to feel better to ensure infection does not come back. ° °

## 2016-07-21 NOTE — ED Triage Notes (Signed)
Pt started with a sore throat on Monday, then had body aches and chills.  Became congested with a fever on Tuesday and Wednesday.  Took Theraflu last night, but woke up coughing around 4 am last night.

## 2016-07-21 NOTE — ED Provider Notes (Signed)
CSN: [id number]     Arrival date & time 07/21/16  4098 History   First MD Initiated Contact with Patient 07/21/16 0912     Chief Complaint  Patient presents with  . Cough  . Nasal Congestion   (Consider location/radiation/quality/duration/timing/severity/associated sxs/prior Treatment) HPI  Ryan Gregory is a 40 y.o. male presenting to UC with c/o URI symptoms that started 5 days ago.  Symptoms initially started with a sore throat, body aches and chills with temp of 100*F two days ago.  He now has a persistent, worsening cough with congestion, occasionally prodcutive but fever, body aches, and sore throat have resolved. Cough woke him at 4AM this morning. He did take Theraflu last night but denies relief. Denies chest pain or SOB. No hx of asthma. No sick contacts or recent travel.   Past Medical History:  Diagnosis Date  . ADHD 07/09/2008   Qualifier: Diagnosis of  By: Linford Arnold MD, Santina Evans    . DIZZINESS 07/14/2010   Qualifier: Diagnosis of  By: Linford Arnold MD, Santina Evans     History reviewed. No pertinent surgical history. History reviewed. No pertinent family history. Social History  Substance Use Topics  . Smoking status: Never Smoker  . Smokeless tobacco: Not on file  . Alcohol use Yes     Comment: 2 per week    Review of Systems  Constitutional: Positive for chills and fever.  HENT: Positive for congestion and rhinorrhea. Negative for ear pain, sore throat, trouble swallowing and voice change.   Respiratory: Positive for cough. Negative for shortness of breath.   Cardiovascular: Negative for chest pain and palpitations.  Gastrointestinal: Negative for abdominal pain, diarrhea, nausea and vomiting.  Musculoskeletal: Negative for arthralgias, back pain and myalgias.  Skin: Negative for rash.  Neurological: Negative for dizziness, light-headedness and headaches.    Allergies  Review of patient's allergies indicates no known allergies.  Home Medications   Prior to  Admission medications   Medication Sig Start Date End Date Taking? Authorizing Provider  amphetamine-dextroamphetamine (ADDERALL XR) 10 MG 24 hr capsule Take 1 capsule (10 mg total) by mouth daily. 10/14/14   Agapito Games, MD  azithromycin (ZITHROMAX) 250 MG tablet Take 1 tablet (250 mg total) by mouth daily. Take first 2 tablets together, then 1 every day until finished. 07/21/16   Junius Finner, PA-C  benzonatate (TESSALON) 100 MG capsule Take 1-2 capsules (100-200 mg total) by mouth every 8 (eight) hours. 07/21/16   Junius Finner, PA-C  pseudoephedrine (SUDAFED) 60 MG tablet Take 1 tablet (60 mg total) by mouth every 6 (six) hours as needed. 07/21/16   Junius Finner, PA-C   Meds Ordered and Administered this Visit  Medications - No data to display  BP 127/75 (BP Location: Left Arm)   Pulse 74   Temp 98.4 F (36.9 C) (Oral)   Ht 6' (1.829 m)   Wt 205 lb 1.9 oz (93 kg)   SpO2 97%   BMI 27.82 kg/m  No data found.   Physical Exam  Constitutional: He appears well-developed and well-nourished. No distress.  HENT:  Head: Normocephalic and atraumatic.  Right Ear: Tympanic membrane normal.  Left Ear: Tympanic membrane normal.  Nose: Nose normal.  Mouth/Throat: Uvula is midline, oropharynx is clear and moist and mucous membranes are normal.  Eyes: Conjunctivae are normal. No scleral icterus.  Neck: Normal range of motion. Neck supple.  Cardiovascular: Normal rate, regular rhythm and normal heart sounds.   Pulmonary/Chest: Effort normal and breath sounds normal. No stridor.  No respiratory distress. He has no wheezes. He has no rales.  Abdominal: Soft. He exhibits no distension. There is no tenderness.  Musculoskeletal: Normal range of motion.  Lymphadenopathy:    He has no cervical adenopathy.  Neurological: He is alert.  Skin: Skin is warm and dry. He is not diaphoretic.  Nursing note and vitals reviewed.   Urgent Care Course   Clinical Course    Procedures (including  critical care time)  Labs Review Labs Reviewed - No data to display  Imaging Review No results found.    MDM   1. Viral upper respiratory tract infection    Pt c/o URI symptoms that started about 1 week ago.  Most of his symptoms have resolved, however, his cough has worsened, woke him at 4AM this morning.  Pt is afebrile. Lungs: CTAB Will treat as viral illness. Rx: Sudafed and Tessalon. Prescription to hold with expiration date for Azithromycin provided. Pt to fill if fever returns or cough not improving in 4-5 days. F/u with PCP in 5-7 days with PCP as needed. Patient verbalized understanding and agreement with treatment plan.     Junius Finnerrin O'Malley, PA-C 07/21/16 1002

## 2017-03-22 ENCOUNTER — Encounter: Payer: Self-pay | Admitting: Family Medicine

## 2017-03-22 ENCOUNTER — Ambulatory Visit (INDEPENDENT_AMBULATORY_CARE_PROVIDER_SITE_OTHER): Payer: BLUE CROSS/BLUE SHIELD | Admitting: Family Medicine

## 2017-03-22 VITALS — BP 115/68 | HR 59 | Ht 72.0 in | Wt 211.0 lb

## 2017-03-22 DIAGNOSIS — Z0000 Encounter for general adult medical examination without abnormal findings: Secondary | ICD-10-CM

## 2017-03-22 DIAGNOSIS — F902 Attention-deficit hyperactivity disorder, combined type: Secondary | ICD-10-CM

## 2017-03-22 LAB — COMPLETE METABOLIC PANEL WITH GFR
ALT: 13 U/L (ref 9–46)
AST: 18 U/L (ref 10–40)
Albumin: 4.1 g/dL (ref 3.6–5.1)
Alkaline Phosphatase: 50 U/L (ref 40–115)
BILIRUBIN TOTAL: 1.3 mg/dL — AB (ref 0.2–1.2)
BUN: 14 mg/dL (ref 7–25)
CHLORIDE: 106 mmol/L (ref 98–110)
CO2: 22 mmol/L (ref 20–31)
CREATININE: 0.94 mg/dL (ref 0.60–1.35)
Calcium: 9.2 mg/dL (ref 8.6–10.3)
GFR, Est African American: >89 mL/min (ref >=60)
GFR, Est Non African American: >89 mL/min (ref >=60)
GLUCOSE: 82 mg/dL (ref 65–99)
Potassium: 4.2 mmol/L (ref 3.5–5.3)
Sodium: 139 mmol/L (ref 135–146)
TOTAL PROTEIN: 7.1 g/dL (ref 6.1–8.1)

## 2017-03-22 LAB — CBC WITH DIFFERENTIAL/PLATELET
BASOS PCT: 1 %
Basophils Absolute: 39 cells/uL (ref 0–200)
EOS ABS: 39 {cells}/uL (ref 15–500)
Eosinophils Relative: 1 %
HCT: 43.9 % (ref 38.5–50.0)
Hemoglobin: 14.8 g/dL (ref 13.2–17.1)
LYMPHS PCT: 49 %
Lymphs Abs: 1911 cells/uL (ref 850–3900)
MCH: 30 pg (ref 27.0–33.0)
MCHC: 33.7 g/dL (ref 32.0–36.0)
MCV: 88.9 fL (ref 80.0–100.0)
MONO ABS: 351 {cells}/uL (ref 200–950)
MONOS PCT: 9 %
MPV: 10.8 fL (ref 7.5–12.5)
NEUTROS ABS: 1560 {cells}/uL (ref 1500–7800)
Neutrophils Relative %: 40 %
PLATELETS: 282 10*3/uL (ref 140–400)
RBC: 4.94 MIL/uL (ref 4.20–5.80)
RDW: 14.4 % (ref 11.0–15.0)
WBC: 3.9 10*3/uL (ref 3.8–10.8)

## 2017-03-22 LAB — LIPID PANEL W/REFLEX DIRECT LDL
Cholesterol: 159 mg/dL (ref <200)
HDL: 49 mg/dL (ref >40)
LDL-CHOLESTEROL: 98 mg/dL
NON-HDL CHOLESTEROL (CALC): 110 mg/dL (ref <130)
Total CHOL/HDL Ratio: 3.2 Ratio (ref <5.0)
Triglycerides: 42 mg/dL (ref <150)

## 2017-03-22 LAB — TSH: TSH: 0.9 m[IU]/L (ref 0.40–4.50)

## 2017-03-22 MED ORDER — AMPHETAMINE-DEXTROAMPHET ER 10 MG PO CP24: 10.0000 mg | ORAL_CAPSULE | Freq: Every day | ORAL | 0 refills | Status: DC

## 2017-03-22 NOTE — Patient Instructions (Addendum)

## 2017-03-22 NOTE — Progress Notes (Signed)
Subjective:    Patient ID: Ryan Gregory, male    DOB: 10/24/1975, 41 y.o.   MRN: [id number]  HPI [age] year old male here today for complete physical exam. Is currently on medication for his ADHD. He is doing well with no specific concerns. He says sometimes he does feel a little overly tired during the day and is not sure why but it's not every day. He says he gets about 6-7 hours of sleep each night. He snores occasionally but not every night. No morning headaches. He does have a father with sleep apnea.   Review of Systems   Conference of review of systems is negative.  BP 115/68   Pulse (!) 59   Ht 6' (1.829 m)   Wt 211 lb (95.7 kg)   BMI 28.62 kg/m     No Known Allergies  Past Medical History:  Diagnosis Date  . ADHD 07/09/2008   Qualifier: Diagnosis of  By: Linford ArnoldMetheney MD, Santina Evansatherine    . DIZZINESS 07/14/2010   Qualifier: Diagnosis of  By: Linford ArnoldMetheney MD, Santina Evansatherine      No past surgical history on file.  Social History   Social History  . Marital status: Married    Spouse name: N/A  . Number of children: N/A  . Years of education: N/A   Occupational History  . Manager [employer]   Social History Main Topics  . Smoking status: Never Smoker  . Smokeless tobacco: Never Used  . Alcohol use Yes     Comment: 2 per week  . Drug use: No  . Sexual activity: Not on file   Other Topics Concern  . Not on file   Social History Narrative  . No narrative on file    Family History  Problem Relation Age of Onset  . Sleep apnea Father     Outpatient Encounter Prescriptions as of 03/22/2017  Medication Sig  . amphetamine-dextroamphetamine (ADDERALL XR) 10 MG 24 hr capsule Take 1 capsule (10 mg total) by mouth daily. OK to fill 05/22/2017  . [DISCONTINUED] amphetamine-dextroamphetamine (ADDERALL XR) 10 MG 24 hr capsule Take 1 capsule (10 mg total) by mouth daily.  . [DISCONTINUED] amphetamine-dextroamphetamine (ADDERALL XR) 10 MG 24 hr capsule Take 1  capsule (10 mg total) by mouth daily.  . [DISCONTINUED] amphetamine-dextroamphetamine (ADDERALL XR) 10 MG 24 hr capsule Take 1 capsule (10 mg total) by mouth daily. OK to fill 04/21/2017  . [DISCONTINUED] azithromycin (ZITHROMAX) 250 MG tablet Take 1 tablet (250 mg total) by mouth daily. Take first 2 tablets together, then 1 every day until finished.  . [DISCONTINUED] benzonatate (TESSALON) 100 MG capsule Take 1-2 capsules (100-200 mg total) by mouth every 8 (eight) hours.  . [DISCONTINUED] pseudoephedrine (SUDAFED) 60 MG tablet Take 1 tablet (60 mg total) by mouth every 6 (six) hours as needed.   No facility-administered encounter medications on file as of 03/22/2017.           Objective:   Physical Exam  Constitutional: He is oriented to person, place, and time. He appears well-developed and well-nourished.  HENT:  Head: Normocephalic and atraumatic.  Right Ear: External ear normal.  Left Ear: External ear normal.  Nose: Nose normal.  Mouth/Throat: Oropharynx is clear and moist.  Eyes: Conjunctivae and EOM are normal. Pupils are equal, round, and reactive to light.  Neck: Normal range of motion. Neck supple. No thyromegaly present.  Cardiovascular: Normal rate, regular rhythm, normal heart sounds and intact distal pulses.  Pulmonary/Chest: Effort normal and breath sounds normal.  Abdominal: Soft. Bowel sounds are normal. He exhibits no distension and no mass. There is no tenderness. There is no rebound and no guarding.  Musculoskeletal: Normal range of motion.  Lymphadenopathy:    He has no cervical adenopathy.  Neurological: He is alert and oriented to person, place, and time. He has normal reflexes.  Skin: Skin is warm and dry.  Psychiatric: He has a normal mood and affect. His behavior is normal. Judgment and thought content normal.          Assessment & Plan:  Keep up a regular exercise program and make sure you are eating a healthy diet Try to eat 4 servings of dairy a  day, or if you are lactose intolerant take a calcium with vitamin D daily.  Your vaccines are up to date.   ADHD- Thyroid medication without any side effects. No recent chest pain or shortness of breath. Blood pressure at goal. Will refill medications for the next 90 days. Follow-up in 4 months for refills.

## 2017-08-06 ENCOUNTER — Emergency Department (INDEPENDENT_AMBULATORY_CARE_PROVIDER_SITE_OTHER)
Admission: EM | Admit: 2017-08-06 | Discharge: 2017-08-06 | Disposition: A | Discharge status: Home / Self Care | Payer: BLUE CROSS/BLUE SHIELD | Source: Home / Self Care

## 2017-08-06 ENCOUNTER — Encounter: Payer: Self-pay | Admitting: *Deleted

## 2017-08-06 DIAGNOSIS — Z23 Encounter for immunization: Secondary | ICD-10-CM | POA: Diagnosis not present

## 2017-08-06 MED ORDER — INFLUENZA VAC SPLIT QUAD 0.5 ML IM SUSY
0.5000 mL | PREFILLED_SYRINGE | INTRAMUSCULAR | Status: AC
  Administered 2017-08-06: 0.5 mL via INTRAMUSCULAR

## 2017-08-06 NOTE — ED Triage Notes (Signed)
The pt is here today for an influenza vaccine.   

## 2018-09-04 ENCOUNTER — Ambulatory Visit (HOSPITAL_COMMUNITY)
Admission: RE | Admit: 2018-09-04 | Discharge: 2018-09-04 | Disposition: A | Discharge status: Home / Self Care | Payer: BLUE CROSS/BLUE SHIELD | Attending: Psychiatry | Admitting: Psychiatry

## 2018-09-04 ENCOUNTER — Encounter (HOSPITAL_COMMUNITY): Payer: Self-pay | Admitting: Behavioral Health

## 2018-09-04 NOTE — H&P (Signed)
Behavioral Health Medical Screening Exam  Ryan CaneWilliam G Gregory is an 42 y.o. male patient presents as walk in at [facility] with complaints of feeling overwhelmed but not knowing really why feeling that way.  States that he thinks about past events and thing he wishes he could change.  Reports an increase in stress, and arguments with his wife.  States he has a therapy visit scheduled for 09/18/18 but wanted to check to see if he could get something sooner.  Patient denies suicidal/self-harm/homicidal ideation, psychosis, and paranoia.  No prior psychiatric issues other than being diagnosed with ADHD when younger  Total Time spent with patient: 30 minutes  Psychiatric Specialty Exam: Physical Exam  Vitals reviewed. Constitutional: He is oriented to person, place, and time. He appears well-developed and well-nourished. No distress.  Neck: Normal range of motion. Neck supple.  Respiratory: Effort normal.  Musculoskeletal: Normal range of motion.  Neurological: He is alert and oriented to person, place, and time.  Skin: Skin is warm and dry.  Psychiatric: His speech is normal and behavior is normal. Judgment and thought content normal. His mood appears anxious (Stable). Thought content is not paranoid and not delusional. Cognition and memory are normal. He expresses no homicidal and no suicidal ideation.    Review of Systems  Psychiatric/Behavioral: Depression: Denies. Hallucinations: Denies. Memory loss: Denies. Substance abuse: Denies. Suicidal ideas: Denies. Nervous/anxious: Feeling overwhelmed. Insomnia: Denies.     Blood pressure 122/65, pulse 65, temperature 98.4 F (36.9 C), resp. rate 16, SpO2 99 %.There is no height or weight on file to calculate BMI.  General Appearance: Casual  Eye Contact:  Good  Speech:  Clear and Coherent and Normal Rate  Volume:  Normal  Mood:  Appropriate  Affect:  Appropriate and Congruent  Thought Process:  Coherent and Goal Directed  Orientation:  Full (Time,  Place, and Person)  Thought Content:  WDL and Logical  Suicidal Thoughts:  No  Homicidal Thoughts:  No  Memory:  Immediate;   Good Recent;   Good Remote;   Good  Judgement:  Intact  Insight:  Present  Psychomotor Activity:  Normal  Concentration: Concentration: Good and Attention Span: Good  Recall:  Good  Fund of Knowledge:Good  Language: Good  Akathisia:  No  Handed:  Right  AIMS (if indicated):     Assets:  Communication Skills Desire for Improvement Financial Resources/Insurance Housing Leisure Time Physical Health Social Support Transportation  Sleep:       Musculoskeletal: Strength & Muscle Tone: within normal limits Gait & Station: normal Patient leans: N/A  Blood pressure 122/65, pulse 65, temperature 98.4 F (36.9 C), resp. rate 16, SpO2 99 %.  Recommendations:  Keep scheduled appointment with therapist.  Give community resources for outpatient psychiatric services  Disposition: No evidence of imminent risk to self or others at present.   Patient does not meet criteria for psychiatric inpatient admission. Supportive therapy provided about ongoing stressors. Discussed crisis plan, support from social network, calling 911, coming to the Emergency Department, and calling Suicide Hotline.  Based on my evaluation the patient does not appear to have an emergency medical condition.   , NP 09/04/2018, 5:29 PM

## 2018-09-04 NOTE — BH Assessment (Signed)
Assessment Note  Ryan Gregory is a 42 y.o. male who presented to [facility] as a voluntary walk-in with complaint of increased stress and despondency due to work stress and recent death of his nephew.  Pt lives in [place] with his wife, and he works for [employer].  Pt stated that he came to [facility] because he tried to get an appointment with [facility] in [place], and he was advised he could not be seen without a referral.  Pt has not been assessed by TTS before.  Pt reported that over the past few months, he has experienced an increase in despondency, anxiety and overall ''stress'' due to the death of his nephew and workplace stress.  Pt does not have any outpatient psychiatric or therapy services at this moment, although he is scheduled for an outpatient therapy appointment in early December.  In addition to despondency, anxiety, and stress, Pt endorsed insomnia and rumination.  Pt denied suicidal ideation, homicidal ideation, auditory/visual hallucination, and substance use concerns.  Pt reported feeling safe to leave -- he presented because he hoped for a referral to [facility] outpatient.  During assessment, Pt presented as alert and oriented.  He had good eye contact and was cooperative.  Pt was dressed in street clothes, and he appeared appropriately groomed.  Pt's mood was sad, and affect was mood-congruent.  Pt endorsed symptoms consistent with adjustment disorder with depressed featured.  Pt denied current suicidal ideation, homicidal ideation, hallucination, and substance use concerns.  Pt's speech was normal in rate, rhythm, and volume.  Pt's thought processes were within normal range, and thought content was logical and goal-oriented.  There was no evidence of delusion.  Pt's memory and concentration were intact.  Insight, judgment, and impulse control was good.  Consulted with S. Rankin, who also met with Pt.  Pt is to be discharged with outpatient resources.    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 ADHD 07/09/2008   Qualifier: Diagnosis of  By: Madilyn Fireman MD, Barnetta Chapel    . DIZZINESS 07/14/2010   Qualifier: Diagnosis of  By: Madilyn Fireman MD, Barnetta Chapel      No past surgical history on file.  Family History:  Family History  Problem Relation Age of Onset  . Sleep apnea Father     Social History:  reports that he has never smoked. He has never used smokeless tobacco. He reports that he drinks alcohol. He reports that he does not use drugs.  Additional Social History:  Alcohol / Drug Use Pain Medications: See MAR Prescriptions: See MAR Over the Counter: See MAR History of alcohol / drug use?: No history of alcohol / drug abuse  CIWA: CIWA-Ar BP: 122/65 Pulse Rate: 65 COWS:    Allergies: No Known Allergies  Home Medications:  (Not in a hospital admission)  OB/GYN Status:  No LMP for male patient.  General Assessment Data Location of Assessment: [facility] Assessment Services TTS Assessment: In system Is this a Tele or Face-to-Face Assessment?: Face-to-Face Is this an Initial Assessment or a Re-assessment for this encounter?: Initial Assessment Patient Accompanied by:: N/A Language Other than English: No Living Arrangements: (ife) What gender do you identify as?: Male Marital status: Married Pregnancy Status: No Living Arrangements: Spouse/significant other Can pt return to current living arrangement?: Yes Admission Status: Voluntary Is patient capable of signing voluntary admission?: Yes Referral Source: Self/Family/Friend Insurance type: Insurance risk surveyor Exam ([place]) Medical  Exam completed: Yes  Crisis Care Plan Living Arrangements: Spouse/significant other Name of Psychiatrist: None currently Name of Therapist: None currently  Education Status Is patient currently in school?: No Is the patient employed, unemployed or receiving  disability?: Employed  Risk to self with the past 6 months Suicidal Ideation: No Has patient been a risk to self within the past 6 months prior to admission? : No Suicidal Intent: No Has patient had any suicidal intent within the past 6 months prior to admission? : No Is patient at risk for suicide?: No Suicidal Plan?: No Has patient had any suicidal plan within the past 6 months prior to admission? : No Access to Means: No What has been your use of drugs/alcohol within the last 12 months?: Alcohol Previous Attempts/Gestures: Yes How many times?: 1 Other Self Harm Risks: None indicated Triggers for Past Attempts: Unknown Intentional Self Injurious Behavior: None Family Suicide History: No Recent stressful life event(s): Loss (Comment), Other (Comment)(Death of nephew; job stress) Persecutory voices/beliefs?: No Depression: Yes Depression Symptoms: Despondent, Feeling angry/irritable, Isolating, Insomnia Substance abuse history and/or treatment for substance abuse?: No Suicide prevention information given to non-admitted patients: Not applicable  Risk to Others within the past 6 months Homicidal Ideation: No Does patient have any lifetime risk of violence toward others beyond the six months prior to admission? : No Thoughts of Harm to Others: No Current Homicidal Intent: No Current Homicidal Plan: No Access to Homicidal Means: No History of harm to others?: No Assessment of Violence: None Noted Does patient have access to weapons?: No Criminal Charges Pending?: No Does patient have a court date: No Is patient on probation?: No  Psychosis Hallucinations: None noted Delusions: None noted  Mental Status Report Appearance/Hygiene: Unremarkable, Other (Comment)(street clothes) Eye Contact: Good Motor Activity: Freedom of movement, Unremarkable Speech: Logical/coherent Level of Consciousness: Alert Mood: Preoccupied, Sad Affect: Appropriate to circumstance Anxiety Level:  None Thought Processes: Coherent, Relevant Judgement: Partial Orientation: Person, Time, Situation, Place Obsessive Compulsive Thoughts/Behaviors: None  Cognitive Functioning Concentration: Normal Memory: Recent Intact, Remote Intact Is patient IDD: No Insight: Good Impulse Control: Good Appetite: Poor Have you had any weight changes? : Loss Amount of the weight change? (lbs): 8 lbs Sleep: Decreased Total Hours of Sleep: 6 Vegetative Symptoms: None  ADLScreening [facility] Assessment Services) Patient's cognitive ability adequate to safely complete daily activities?: Yes Patient able to express need for assistance with ADLs?: Yes Independently performs ADLs?: Yes (appropriate for developmental age)  Prior Inpatient Therapy Prior Inpatient Therapy: No  Prior Outpatient Therapy Prior Outpatient Therapy: Yes Reason for Treatment: Marriage counseling Does patient have an ACCT team?: No Does patient have Intensive In-House Services?  : No Does patient have [facility] services? : No Does patient have P4CC services?: No  ADL Screening (condition at time of admission) Patient's cognitive ability adequate to safely complete daily activities?: Yes Is the patient deaf or have difficulty hearing?: No Does the patient have difficulty seeing, even when wearing glasses/contacts?: No Does the patient have difficulty concentrating, remembering, or making decisions?: No Patient able to express need for assistance with ADLs?: Yes Does the patient have difficulty dressing or bathing?: No Independently performs ADLs?: Yes (appropriate for developmental age) Does the patient have difficulty walking or climbing stairs?: No Weakness of Legs: None Weakness of Arms/Hands: None  Home Assistive Devices/Equipment Home Assistive Devices/Equipment: None  Therapy Consults (therapy consults require a physician order) PT Evaluation Needed: No OT Evalulation Needed: No SLP Evaluation Needed:  No Abuse/Neglect Assessment (Assessment to  be complete while patient is alone) Abuse/Neglect Assessment Can Be Completed: Yes Physical Abuse: Denies Verbal Abuse: Denies Sexual Abuse: Denies Exploitation of patient/patient's resources: Denies Self-Neglect: Denies Values / Beliefs Cultural Requests During Hospitalization: None Spiritual Requests During Hospitalization: None Consults Spiritual Care Consult Needed: No Social Work Consult Needed: No Regulatory affairs officer (For Healthcare) Does Patient Have a Medical Advance Directive?: No Would patient like information on creating a medical advance directive?: No - Patient declined          Disposition:  Disposition Initial Assessment Completed for this Encounter: Yes Disposition of Patient: Discharge(Per S. Rankin, NP, Pt does not meet inpt criteria)  On Site Evaluation by:   Reviewed with Physician:    Laurena Slimmer Tiarrah Saville 09/04/2018 5:37 PM

## 2018-09-05 ENCOUNTER — Encounter: Payer: Self-pay | Admitting: Family Medicine

## 2018-09-05 ENCOUNTER — Ambulatory Visit (INDEPENDENT_AMBULATORY_CARE_PROVIDER_SITE_OTHER): Payer: Managed Care, Other (non HMO) | Admitting: Family Medicine

## 2018-09-05 VITALS — BP 110/53 | HR 72 | Ht 71.46 in | Wt 214.0 lb

## 2018-09-05 DIAGNOSIS — F418 Other specified anxiety disorders: Secondary | ICD-10-CM | POA: Diagnosis not present

## 2018-09-05 DIAGNOSIS — Z23 Encounter for immunization: Secondary | ICD-10-CM

## 2018-09-05 DIAGNOSIS — F902 Attention-deficit hyperactivity disorder, combined type: Secondary | ICD-10-CM | POA: Diagnosis not present

## 2018-09-05 MED ORDER — FLUOXETINE HCL 20 MG PO TABS: ORAL_TABLET | ORAL | 1 refills | Status: DC

## 2018-09-05 NOTE — Progress Notes (Signed)
Subjective:    CC: Mood, new complaint.    42 year old male is here today to discuss his mood.  Over the last year he is just been struggling emotionally.  He is been having some angry outbursts and major irritability.  Is happened at work and at home.  He said his if his boss were not is understanding he probably would have lost his job by now.  He says yesterday he actually tried to call behavioral health downstairs and was told that he needed a referral.  He then drove to behavioral health in [place] and was assessed.  Because he was not suicidal they were not able to help him immediately but did give him some phone numbers.  He has scheduled a counseling appointment in early December and he plans to keep.  But is also here today to ask for help.  ADHD-he is not currently taking medication he.  In fact he is been on and off over the last couple years because he is always worried about long-term side effects.  Past medical history, Surgical history, Family history not pertinant except as noted below, Social history, Allergies, and medications have been entered into the medical record, reviewed, and corrections made.   Review of Systems: No fevers, chills, night sweats, weight loss, chest pain, or shortness of breath.   Objective:    General: Well Developed, well nourished, and in no acute distress.  Neuro: Alert and oriented x3, extra-ocular muscles intact, sensation grossly intact.  HEENT: Normocephalic, atraumatic  Skin: Warm and dry, no rashes. Cardiac: Regular rate and rhythm, no murmurs rubs or gallops, no lower extremity edema.  Respiratory: Clear to auscultation bilaterally. Not using accessory muscles, speaking in full sentences.   Impression and Recommendations:   Depression and anxiety -PHQ 9 score of 12.  We discussed options.  He denies he has an appointment coming up for counseling which is great.  I explained that I think this is helpful in the long run we also discussed  medication as a possibility in addition to self-care such as eating a healthy diet and regular exercise.  He is open to medication at this point.  We discussed how the medications work and potential benefits and risks.  We discussed potential side effects as well.  We will start with fluoxetine.  I will see him back in 3 to 4 weeks and we can make adjustments to the medication at that time.  ADHD-he wants to hold off on medication at this point.  He feels like when he is very organized he is able to manage his symptoms.  Time spent is 25 min, rhythm 50% time spent face-to-face counseling about depression and anxiety and ADHD.

## 2018-09-19 ENCOUNTER — Telehealth: Payer: Self-pay

## 2018-09-19 DIAGNOSIS — F418 Other specified anxiety disorders: Secondary | ICD-10-CM

## 2018-09-19 NOTE — Telephone Encounter (Signed)
Referral placed.

## 2018-09-19 NOTE — Telephone Encounter (Signed)
Pt called requesting a referral be put in for him for behavioral health downstairs.   Pt reports he recently discussed this with Dr Linford ArnoldMetheney in office, states his councilor recommended he get an evaluation by psychiatrist.

## 2018-09-19 NOTE — Telephone Encounter (Signed)
OK to place refer. Just mention needs med consult.

## 2018-10-01 ENCOUNTER — Ambulatory Visit (INDEPENDENT_AMBULATORY_CARE_PROVIDER_SITE_OTHER): Payer: Managed Care, Other (non HMO) | Admitting: Family Medicine

## 2018-10-01 ENCOUNTER — Encounter: Payer: Self-pay | Admitting: Family Medicine

## 2018-10-01 VITALS — BP 129/60 | HR 69 | Ht 71.0 in | Wt 216.0 lb

## 2018-10-01 DIAGNOSIS — F418 Other specified anxiety disorders: Secondary | ICD-10-CM

## 2018-10-01 MED ORDER — FLUOXETINE HCL 40 MG PO CAPS: 40.0000 mg | ORAL_CAPSULE | Freq: Every day | ORAL | 1 refills | Status: DC

## 2018-10-01 NOTE — Progress Notes (Signed)
Subjective:    CC:   HPI:  42 year old male comes in today since today for depression and anxiety.  Started him on fluoxetine about 4 weeks ago.  He just really has not noticed a big improvement in his mood.  He said he had a few headaches the first week that he started it and has noticed that if he feels really anxious his heart will race a little bit more he does not remember quite feeling that before.  He has still been working with his therapist and has a consultation with psychiatry on January 8 for medication management.  He feels like overall his sleep is okay.  He still having maybe 1-2 nights per week where he is up and his mind is just going.  Past medical history, Surgical history, Family history not pertinant except as noted below, Social history, Allergies, and medications have been entered into the medical record, reviewed, and corrections made.   Review of Systems: No fevers, chills, night sweats, weight loss, chest pain, or shortness of breath.   Objective:    General: Well Developed, well nourished, and in no acute distress.  Neuro: Alert and oriented x3, extra-ocular muscles intact, sensation grossly intact.  HEENT: Normocephalic, atraumatic  Skin: Warm and dry, no rashes. Cardiac: Regular rate and rhythm, no murmurs rubs or gallops, no lower extremity edema.  Respiratory: Clear to auscultation bilaterally. Not using accessory muscles, speaking in full sentences.   Impression and Recommendations:    Depression/anxiety-we discussed options.  At this point he really has not had a great response to the fluoxetine.  We could certainly try a higher strength for a few weeks and see if he notices any improvement or we can consider discontinuing the medication and switching to something new.  He did rather try a higher dose at least initially so I think it is worth doing that for the next 2 to 3 weeks until he sees psychiatry.  At that point they can discuss changing the medication  and if he is not responding.

## 2018-10-23 ENCOUNTER — Ambulatory Visit (INDEPENDENT_AMBULATORY_CARE_PROVIDER_SITE_OTHER): Payer: 59 | Admitting: Psychiatry

## 2018-10-23 ENCOUNTER — Other Ambulatory Visit: Payer: Self-pay

## 2018-10-23 ENCOUNTER — Encounter (HOSPITAL_COMMUNITY): Payer: Self-pay | Admitting: Psychiatry

## 2018-10-23 VITALS — BP 140/90 | HR 64 | Ht 72.0 in | Wt 217.0 lb

## 2018-10-23 DIAGNOSIS — F411 Generalized anxiety disorder: Secondary | ICD-10-CM | POA: Diagnosis not present

## 2018-10-23 DIAGNOSIS — F0630 Mood disorder due to known physiological condition, unspecified: Secondary | ICD-10-CM | POA: Diagnosis not present

## 2018-10-23 MED ORDER — ESCITALOPRAM OXALATE 10 MG PO TABS: 10.0000 mg | ORAL_TABLET | Freq: Every day | ORAL | 1 refills | Status: DC

## 2018-10-23 MED ORDER — LAMOTRIGINE 25 MG PO TABS
25.0000 mg | ORAL_TABLET | Freq: Every day | ORAL | 0 refills | Status: DC
Start: 2018-10-23 — End: 2018-11-18
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
## 2018-11-18 ENCOUNTER — Other Ambulatory Visit: Payer: Self-pay

## 2018-11-18 ENCOUNTER — Encounter (HOSPITAL_COMMUNITY): Payer: Self-pay | Admitting: Psychiatry

## 2018-11-18 ENCOUNTER — Ambulatory Visit (INDEPENDENT_AMBULATORY_CARE_PROVIDER_SITE_OTHER): Payer: 59 | Admitting: Psychiatry

## 2018-11-18 VITALS — BP 122/76 | HR 65 | Ht 71.0 in | Wt 226.0 lb

## 2018-11-18 DIAGNOSIS — F411 Generalized anxiety disorder: Secondary | ICD-10-CM | POA: Diagnosis not present

## 2018-11-18 DIAGNOSIS — F0630 Mood disorder due to known physiological condition, unspecified: Secondary | ICD-10-CM

## 2018-11-18 MED ORDER — LAMOTRIGINE 100 MG PO TABS: 100.0000 mg | ORAL_TABLET | Freq: Every day | ORAL | 0 refills | Status: DC
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
## 2018-12-09 ENCOUNTER — Encounter (HOSPITAL_COMMUNITY): Payer: Self-pay | Admitting: Psychiatry

## 2018-12-09 ENCOUNTER — Ambulatory Visit (INDEPENDENT_AMBULATORY_CARE_PROVIDER_SITE_OTHER): Payer: 59 | Admitting: Psychiatry

## 2018-12-09 DIAGNOSIS — F411 Generalized anxiety disorder: Secondary | ICD-10-CM | POA: Diagnosis not present

## 2018-12-09 DIAGNOSIS — F0630 Mood disorder due to known physiological condition, unspecified: Secondary | ICD-10-CM | POA: Diagnosis not present

## 2018-12-09 MED ORDER — ESCITALOPRAM OXALATE 10 MG PO TABS: 10.0000 mg | ORAL_TABLET | Freq: Every day | ORAL | 0 refills | Status: DC

## 2018-12-09 MED ORDER — LAMOTRIGINE 100 MG PO TABS: 100.0000 mg | ORAL_TABLET | Freq: Every day | ORAL | 0 refills | Status: DC
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
## 2018-12-13 ENCOUNTER — Other Ambulatory Visit (HOSPITAL_COMMUNITY): Payer: Self-pay | Admitting: Psychiatry

## 2019-03-03 ENCOUNTER — Encounter (HOSPITAL_COMMUNITY): Payer: Self-pay | Admitting: Psychiatry

## 2019-03-03 ENCOUNTER — Ambulatory Visit (INDEPENDENT_AMBULATORY_CARE_PROVIDER_SITE_OTHER): Payer: 59 | Admitting: Psychiatry

## 2019-03-03 DIAGNOSIS — F411 Generalized anxiety disorder: Secondary | ICD-10-CM | POA: Diagnosis not present

## 2019-03-03 DIAGNOSIS — F0630 Mood disorder due to known physiological condition, unspecified: Secondary | ICD-10-CM

## 2019-03-03 MED ORDER — LAMOTRIGINE 100 MG PO TABS: 100.0000 mg | ORAL_TABLET | Freq: Every day | ORAL | 0 refills | Status: DC

## 2019-03-03 MED ORDER — ESCITALOPRAM OXALATE 10 MG PO TABS: 10.0000 mg | ORAL_TABLET | Freq: Every day | ORAL | 0 refills | Status: DC
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
## 2019-06-03 ENCOUNTER — Ambulatory Visit (HOSPITAL_COMMUNITY): Payer: 59 | Admitting: Psychiatry

## 2019-06-17 ENCOUNTER — Other Ambulatory Visit: Payer: Self-pay

## 2019-06-17 ENCOUNTER — Ambulatory Visit (HOSPITAL_COMMUNITY): Payer: 59 | Admitting: Psychiatry

## 2019-06-22 ENCOUNTER — Other Ambulatory Visit (HOSPITAL_COMMUNITY): Payer: Self-pay | Admitting: Psychiatry

## 2019-07-12 ENCOUNTER — Emergency Department (INDEPENDENT_AMBULATORY_CARE_PROVIDER_SITE_OTHER)
Admission: EM | Admit: 2019-07-12 | Discharge: 2019-07-12 | Disposition: A | Discharge status: Home / Self Care | Payer: Managed Care, Other (non HMO) | Source: Home / Self Care

## 2019-07-12 ENCOUNTER — Other Ambulatory Visit: Payer: Self-pay

## 2019-07-12 ENCOUNTER — Encounter: Payer: Self-pay | Admitting: Emergency Medicine

## 2019-07-12 DIAGNOSIS — Z23 Encounter for immunization: Secondary | ICD-10-CM | POA: Diagnosis not present

## 2019-07-12 MED ORDER — INFLUENZA VAC SPLIT QUAD 0.5 ML IM SUSY
0.5000 mL | PREFILLED_SYRINGE | Freq: Once | INTRAMUSCULAR | Status: AC
  Administered 2019-07-12: 0.5 mL via INTRAMUSCULAR

## 2019-07-12 NOTE — ED Triage Notes (Signed)
Desires influenza vaccination today.

## 2019-08-13 ENCOUNTER — Other Ambulatory Visit (HOSPITAL_COMMUNITY): Payer: Self-pay

## 2019-08-13 MED ORDER — ESCITALOPRAM OXALATE 10 MG PO TABS: 10.0000 mg | ORAL_TABLET | Freq: Every day | ORAL | 0 refills | Status: DC

## 2019-08-13 MED ORDER — LAMOTRIGINE 100 MG PO TABS: 100.0000 mg | ORAL_TABLET | Freq: Every day | ORAL | 0 refills | Status: DC

## 2019-10-30 ENCOUNTER — Encounter: Payer: Self-pay | Admitting: Family Medicine

## 2019-10-30 ENCOUNTER — Other Ambulatory Visit: Payer: Self-pay

## 2019-10-30 ENCOUNTER — Ambulatory Visit (INDEPENDENT_AMBULATORY_CARE_PROVIDER_SITE_OTHER): Payer: Managed Care, Other (non HMO) | Admitting: Family Medicine

## 2019-10-30 VITALS — BP 116/59 | HR 61 | Ht 71.0 in | Wt 237.0 lb

## 2019-10-30 DIAGNOSIS — Z0000 Encounter for general adult medical examination without abnormal findings: Secondary | ICD-10-CM | POA: Diagnosis not present

## 2019-10-30 DIAGNOSIS — F902 Attention-deficit hyperactivity disorder, combined type: Secondary | ICD-10-CM | POA: Diagnosis not present

## 2019-10-30 DIAGNOSIS — R21 Rash and other nonspecific skin eruption: Secondary | ICD-10-CM | POA: Diagnosis not present

## 2019-10-30 MED ORDER — TRIAMCINOLONE ACETONIDE 0.5 % EX OINT: 1.0000 "application " | TOPICAL_OINTMENT | Freq: Two times a day (BID) | CUTANEOUS | 0 refills | Status: DC

## 2019-10-30 MED ORDER — AMPHETAMINE-DEXTROAMPHET ER 10 MG PO CP24: 10.0000 mg | ORAL_CAPSULE | Freq: Every day | ORAL | 0 refills | Status: DC

## 2019-10-30 NOTE — Progress Notes (Signed)
CPE  Established Patient Office Visit  Subjective:  Patient ID: Ryan Gregory, male    DOB: 21-Aug-1976  Age: 44 y.o. MRN: [id number]  CC:  Chief Complaint  Patient presents with  . Annual Exam    HPI Ryan Gregory presents for CPE.  He is actually doing really well overall.  He works out 3 to 4 days/week.  He is back in school and completing a masters program and wants to know if he could restart his ADHD medication he has not been on it in quite some time.  He is also looks on Lexapro and just want to make sure that it was safe with his medications.  He also has developed a rash on the dorsum of his wrist and the area of his watch base.  It never seems to extend where the actual watchband is just where the actual head of the watch sits.  He has been trying to keep the area dry he claims is watched well.  He is even is completely switched wrist to see if that would help and then the same rash started to form on the opposite wrist.  Past Medical History:  Diagnosis Date  . ADHD 07/09/2008   Qualifier: Diagnosis of  By: Madilyn Fireman MD, Barnetta Chapel    . DIZZINESS 07/14/2010   Qualifier: Diagnosis of  By: Madilyn Fireman MD, Barnetta Chapel      No past surgical history on file.  Family History  Problem Relation Age of Onset  . Sleep apnea Father     Social History   Socioeconomic History  . Marital status: Married    Spouse name: Not on file  . Number of children: 1  . Years of education: Not on file  . Highest education level: Not on file  Occupational History  . Occupation: Buyer, retail: [employer]  Tobacco Use  . Smoking status: Never Smoker  . Smokeless tobacco: Never Used  Substance and Sexual Activity  . Alcohol use: Yes    Comment: 2-3 per week, beer and wine  . Drug use: Not Currently    Types: Marijuana  . Sexual activity: Yes  Other Topics Concern  . Not on file  Social History Narrative   Pt lives in [place] with wife; 5 year old son is in college    Social Determinants of Health   Financial Resource Strain:   . Difficulty of Paying Living Expenses: Not on file  Food Insecurity:   . Worried About Charity fundraiser in the Last Year: Not on file  . Ran Out of Food in the Last Year: Not on file  Transportation Needs:   . Lack of Transportation (Medical): Not on file  . Lack of Transportation (Non-Medical): Not on file  Physical Activity:   . Days of Exercise per Week: Not on file  . Minutes of Exercise per Session: Not on file  Stress:   . Feeling of Stress : Not on file  Social Connections:   . Frequency of Communication with Friends and Family: Not on file  . Frequency of Social Gatherings with Friends and Family: Not on file  . Attends Religious Services: Not on file  . Active Member of Clubs or Organizations: Not on file  . Attends Archivist Meetings: Not on file  . Marital Status: Not on file  Intimate Partner Violence:   . Fear of Current or Ex-Partner: Not on file  . Emotionally Abused: Not on file  .  Physically Abused: Not on file  . Sexually Abused: Not on file    Outpatient Medications Prior to Visit  Medication Sig Dispense Refill  . escitalopram (LEXAPRO) 10 MG tablet Take 1 tablet (10 mg total) by mouth daily. 90 tablet 0  . lamoTRIgine (LAMICTAL) 100 MG tablet Take 1 tablet (100 mg total) by mouth daily. Take one a day 90 tablet 0   No facility-administered medications prior to visit.    No Known Allergies  ROS Review of Systems    Objective:    Physical Exam  Constitutional: He is oriented to person, place, and time. He appears well-developed and well-nourished.  HENT:  Head: Normocephalic and atraumatic.  Right Ear: External ear normal.  Left Ear: External ear normal.  Nose: Nose normal.  Mouth/Throat: Oropharynx is clear and moist.  Eyes: Pupils are equal, round, and reactive to light. Conjunctivae and EOM are normal.  Neck: No thyromegaly present.  Cardiovascular: Normal rate,  regular rhythm, normal heart sounds and intact distal pulses.  Pulmonary/Chest: Effort normal and breath sounds normal.  Abdominal: Soft. Bowel sounds are normal. He exhibits no distension and no mass. There is no abdominal tenderness. There is no rebound and no guarding.  Musculoskeletal:        General: Normal range of motion.     Cervical back: Normal range of motion and neck supple.  Lymphadenopathy:    He has no cervical adenopathy.  Neurological: He is alert and oriented to person, place, and time. He has normal reflexes.  Skin: Skin is warm and dry.  Psychiatric: He has a normal mood and affect. His behavior is normal. Judgment and thought content normal.    BP (!) 116/59   Pulse 61   Ht 5\' 11"  (1.803 m)   Wt 237 lb (107.5 kg)   SpO2 100%   BMI 33.05 kg/m  Wt Readings from Last 3 Encounters:  10/30/19 237 lb (107.5 kg)  07/12/19 217 lb (98.4 kg)  10/01/18 216 lb (98 kg)     There are no preventive care reminders to display for this patient.  There are no preventive care reminders to display for this patient.  Lab Results  Component Value Date   TSH 0.90 03/22/2017   Lab Results  Component Value Date   WBC 3.9 03/22/2017   HGB 14.8 03/22/2017   HCT 43.9 03/22/2017   MCV 88.9 03/22/2017   PLT 282 03/22/2017   Lab Results  Component Value Date   NA 139 03/22/2017   K 4.2 03/22/2017   CO2 22 03/22/2017   GLUCOSE 82 03/22/2017   BUN 14 03/22/2017   CREATININE 0.94 03/22/2017   BILITOT 1.3 (H) 03/22/2017   ALKPHOS 50 03/22/2017   AST 18 03/22/2017   ALT 13 03/22/2017   PROT 7.1 03/22/2017   ALBUMIN 4.1 03/22/2017   CALCIUM 9.2 03/22/2017   Lab Results  Component Value Date   CHOL 159 03/22/2017   Lab Results  Component Value Date   HDL 49 03/22/2017   Lab Results  Component Value Date   LDLCALC 104 (H) 10/14/2014   Lab Results  Component Value Date   TRIG 42 03/22/2017   Lab Results  Component Value Date   CHOLHDL 3.2 03/22/2017   No  results found for: HGBA1C    Assessment & Plan:   Problem List Items Addressed This Visit      Other   Attention deficit hyperactivity disorder (ADHD)   Relevant Medications   amphetamine-dextroamphetamine (ADDERALL XR)  10 MG 24 hr capsule    Other Visit Diagnoses    Wellness examination    -  Primary   Relevant Orders   COMPLETE METABOLIC PANEL WITH GFR   Lipid panel   Rash       Relevant Medications   triamcinolone ointment (KENALOG) 0.5 %     Keep up a regular exercise program and make sure you are eating a healthy diet Try to eat 4 servings of dairy a day, or if you are lactose intolerant take a calcium with vitamin D daily.  Your vaccines are up to date.   ADHD-we will restart Adderall XR and plan to follow-up in 3 to 4 months we can adjust dose if needed.  Monitor for any chest pain shortness of breath or increased pressure.  Rash -It looks like he is definitely reacting to the base of his watch.  Possible metal allergy, with nickel being the most common metal allergy.  Treat with topical steroid cream to clean up the rash but unfortunately he will have to discontinue wearing his watch.   Meds ordered this encounter  Medications  . triamcinolone ointment (KENALOG) 0.5 %    Sig: Apply 1 application topically 2 (two) times daily.    Dispense:  15 g    Refill:  0  . amphetamine-dextroamphetamine (ADDERALL XR) 10 MG 24 hr capsule    Sig: Take 1 capsule (10 mg total) by mouth daily. OK to fill 05/22/2017    Dispense:  90 capsule    Refill:  0    Follow-up: Return in about 4 months (around 02/27/2020) for ADHD.    Nani Gasser, MD

## 2019-10-30 NOTE — Addendum Note (Signed)
Addended by: Deno Etienne on: 10/30/2019 11:52 AM   Modules accepted: Orders

## 2019-10-30 NOTE — Patient Instructions (Signed)

## 2019-10-31 ENCOUNTER — Encounter: Payer: Self-pay | Admitting: Family Medicine

## 2019-10-31 LAB — COMPLETE METABOLIC PANEL WITH GFR
AG Ratio: 1.4 (calc) (ref 1.0–2.5)
ALT: 14 U/L (ref 9–46)
AST: 19 U/L (ref 10–40)
Albumin: 4.2 g/dL (ref 3.6–5.1)
Alkaline phosphatase (APISO): 53 U/L (ref 36–130)
BUN: 12 mg/dL (ref 7–25)
CO2: 26 mmol/L (ref 20–32)
Calcium: 9.3 mg/dL (ref 8.6–10.3)
Chloride: 106 mmol/L (ref 98–110)
Creat: 0.95 mg/dL (ref 0.60–1.35)
GFR, Est African American: 115 mL/min/{1.73_m2} (ref >=60)
GFR, Est Non African American: 99 mL/min/{1.73_m2} (ref >=60)
Globulin: 2.9 g/dL (calc) (ref 1.9–3.7)
Glucose, Bld: 90 mg/dL (ref 65–99)
Potassium: 4.5 mmol/L (ref 3.5–5.3)
Sodium: 139 mmol/L (ref 135–146)
Total Bilirubin: 0.9 mg/dL (ref 0.2–1.2)
Total Protein: 7.1 g/dL (ref 6.1–8.1)

## 2019-10-31 LAB — LIPID PANEL
Cholesterol: 169 mg/dL (ref <200)
HDL: 48 mg/dL (ref >=40)
LDL Cholesterol (Calc): 106 mg/dL (calc) — ABNORMAL HIGH
Non-HDL Cholesterol (Calc): 121 mg/dL (calc) (ref <130)
Total CHOL/HDL Ratio: 3.5 (calc) (ref <5.0)
Triglycerides: 61 mg/dL (ref <150)

## 2019-11-03 LAB — FUNGAL STAIN
FUNGAL SMEAR:: NONE SEEN
MICRO NUMBER:: 10042930
SPECIMEN QUALITY:: ADEQUATE

## 2019-11-11 ENCOUNTER — Other Ambulatory Visit (HOSPITAL_COMMUNITY): Payer: Self-pay | Admitting: Psychiatry

## 2019-11-12 ENCOUNTER — Other Ambulatory Visit (HOSPITAL_COMMUNITY): Payer: Self-pay | Admitting: Psychiatry

## 2019-12-26 ENCOUNTER — Ambulatory Visit (HOSPITAL_COMMUNITY): Payer: Managed Care, Other (non HMO) | Admitting: Psychiatry

## 2020-02-17 ENCOUNTER — Encounter: Payer: Self-pay | Admitting: Family Medicine

## 2020-02-17 ENCOUNTER — Other Ambulatory Visit: Payer: Self-pay

## 2020-02-17 ENCOUNTER — Ambulatory Visit: Payer: Self-pay | Admitting: Family Medicine

## 2020-02-17 ENCOUNTER — Ambulatory Visit (INDEPENDENT_AMBULATORY_CARE_PROVIDER_SITE_OTHER): Payer: Managed Care, Other (non HMO) | Admitting: Family Medicine

## 2020-02-17 VITALS — BP 109/62 | HR 64 | Ht 71.0 in | Wt 204.0 lb

## 2020-02-17 DIAGNOSIS — F902 Attention-deficit hyperactivity disorder, combined type: Secondary | ICD-10-CM | POA: Diagnosis not present

## 2020-02-17 DIAGNOSIS — F418 Other specified anxiety disorders: Secondary | ICD-10-CM

## 2020-02-17 MED ORDER — AMPHETAMINE-DEXTROAMPHET ER 10 MG PO CP24: 10.0000 mg | ORAL_CAPSULE | Freq: Every day | ORAL | 0 refills | Status: DC

## 2020-02-17 MED ORDER — ESCITALOPRAM OXALATE 10 MG PO TABS: 10.0000 mg | ORAL_TABLET | Freq: Every day | ORAL | 0 refills | Status: DC

## 2020-02-17 MED ORDER — LAMOTRIGINE 100 MG PO TABS: 100.0000 mg | ORAL_TABLET | Freq: Every day | ORAL | 0 refills | Status: DC

## 2020-02-17 NOTE — Assessment & Plan Note (Signed)
Did go ahead and fill his medications for Lamictal and Lexapro today but did discuss with him that I want him to make sure that he gets in for a follow-up with behavioral health before his next refill in August.  Hopefully they will be coming back in person I think that would suit his preference is a little better in regards to his follow-up.

## 2020-02-17 NOTE — Progress Notes (Signed)
Established Patient Office Visit  Subjective:  Patient ID: Ryan Gregory, male    DOB: [birth year]  Age: 44 y.o. MRN: [id number]  CC:  Chief Complaint  Patient presents with  . ADHD    HPI BANYAN GOODCHILD presents for   ADHD - Reports symptoms are well controlled on current regime. Denies any problems with insomnia, chest pain, palpitations, or SOB.  He is happy with his current regimen and would like refills today.  Depression and anxiety-normally he is followed by Dr. Gilmore Laroche in a behavioral health department here in our building.  They have been doing virtual only appointments any studies had difficulty getting something set up and wanted to know if I would be willing to fill his medications for the short-term.  He says he has been taking them regularly though based on his refills it looks like he should be out.  He does feel like his regimen is working well for him.   Past Medical History:  Diagnosis Date  . ADHD 07/09/2008   Qualifier: Diagnosis of  By: Linford Arnold MD, Santina Evans    . DIZZINESS 07/14/2010   Qualifier: Diagnosis of  By: Linford Arnold MD, Santina Evans      No past surgical history on file.  Family History  Problem Relation Age of Onset  . Sleep apnea Father   . Colon cancer Maternal Grandfather 31    Social History   Socioeconomic History  . Marital status: Married    Spouse name: Not on file  . Number of children: 1  . Years of education: Not on file  . Highest education level: Not on file  Occupational History  . Occupation: Event organiser: [employer]  Tobacco Use  . Smoking status: Never Smoker  . Smokeless tobacco: Never Used  Substance and Sexual Activity  . Alcohol use: Yes    Comment: 2-3 per week, beer and wine  . Drug use: Not Currently    Types: Marijuana  . Sexual activity: Yes  Other Topics Concern  . Not on file  Social History Narrative   Pt lives in [place] with wife; 76 year old son is in college   Social  Determinants of Health   Financial Resource Strain:   . Difficulty of Paying Living Expenses:   Food Insecurity:   . Worried About Programme researcher, broadcasting/film/video in the Last Year:   . Barista in the Last Year:   Transportation Needs:   . Freight forwarder (Medical):   Marland Kitchen Lack of Transportation (Non-Medical):   Physical Activity:   . Days of Exercise per Week:   . Minutes of Exercise per Session:   Stress:   . Feeling of Stress :   Social Connections:   . Frequency of Communication with Friends and Family:   . Frequency of Social Gatherings with Friends and Family:   . Attends Religious Services:   . Active Member of Clubs or Organizations:   . Attends Banker Meetings:   Marland Kitchen Marital Status:   Intimate Partner Violence:   . Fear of Current or Ex-Partner:   . Emotionally Abused:   Marland Kitchen Physically Abused:   . Sexually Abused:     Outpatient Medications Prior to Visit  Medication Sig Dispense Refill  . triamcinolone ointment (KENALOG) 0.5 % Apply 1 application topically 2 (two) times daily. 15 g 0  . amphetamine-dextroamphetamine (ADDERALL XR) 10 MG 24 hr capsule Take 1 capsule (10 mg total) by mouth  daily. OK to fill 05/22/2017 90 capsule 0  . escitalopram (LEXAPRO) 10 MG tablet Take 1 tablet (10 mg total) by mouth daily. 90 tablet 0  . FLUoxetine (PROZAC) 40 MG capsule Take 1 capsule (40 mg total) by mouth daily. 30 capsule 1  . lamoTRIgine (LAMICTAL) 100 MG tablet Take 1 tablet (100 mg total) by mouth daily. Take one a day 90 tablet 0   No facility-administered medications prior to visit.    No Known Allergies  ROS Review of Systems    Objective:    Physical Exam  Constitutional: He is oriented to person, place, and time. He appears well-developed and well-nourished.  HENT:  Head: Normocephalic and atraumatic.  Cardiovascular: Normal rate, regular rhythm and normal heart sounds.  Pulmonary/Chest: Effort normal and breath sounds normal.  Neurological: He is  alert and oriented to person, place, and time.  Skin: Skin is warm and dry.  Psychiatric: He has a normal mood and affect. His behavior is normal.    BP 109/62   Pulse 64   Ht 5\' 11"  (1.803 m)   Wt 204 lb (92.5 kg)   SpO2 100%   BMI 28.45 kg/m  Wt Readings from Last 3 Encounters:  02/17/20 204 lb (92.5 kg)  10/30/19 237 lb (107.5 kg)  07/12/19 217 lb (98.4 kg)     There are no preventive care reminders to display for this patient.  There are no preventive care reminders to display for this patient.  Lab Results  Component Value Date   TSH 0.90 03/22/2017   Lab Results  Component Value Date   WBC 3.9 03/22/2017   HGB 14.8 03/22/2017   HCT 43.9 03/22/2017   MCV 88.9 03/22/2017   PLT 282 03/22/2017   Lab Results  Component Value Date   NA 139 10/30/2019   K 4.5 10/30/2019   CO2 26 10/30/2019   GLUCOSE 90 10/30/2019   BUN 12 10/30/2019   CREATININE 0.95 10/30/2019   BILITOT 0.9 10/30/2019   ALKPHOS 50 03/22/2017   AST 19 10/30/2019   ALT 14 10/30/2019   PROT 7.1 10/30/2019   ALBUMIN 4.1 03/22/2017   CALCIUM 9.3 10/30/2019   Lab Results  Component Value Date   CHOL 169 10/30/2019   Lab Results  Component Value Date   HDL 48 10/30/2019   Lab Results  Component Value Date   LDLCALC 106 (H) 10/30/2019   Lab Results  Component Value Date   TRIG 61 10/30/2019   Lab Results  Component Value Date   CHOLHDL 3.5 10/30/2019   No results found for: HGBA1C    Assessment & Plan:   Problem List Items Addressed This Visit      Other   Depression with anxiety    Did go ahead and fill his medications for Lamictal and Lexapro today but did discuss with him that I want him to make sure that he gets in for a follow-up with behavioral health before his next refill in August.  Hopefully they will be coming back in person I think that would suit his preference is a little better in regards to his follow-up.      Relevant Medications   escitalopram (LEXAPRO) 10  MG tablet   Attention deficit hyperactivity disorder (ADHD) - Primary    Controlled on current regimen.  90-day prescription sent to pharmacy.  Follow-up in 6 months.      Relevant Medications   amphetamine-dextroamphetamine (ADDERALL XR) 10 MG 24 hr capsule  Meds ordered this encounter  Medications  . amphetamine-dextroamphetamine (ADDERALL XR) 10 MG 24 hr capsule    Sig: Take 1 capsule (10 mg total) by mouth daily.    Dispense:  90 capsule    Refill:  0  . lamoTRIgine (LAMICTAL) 100 MG tablet    Sig: Take 1 tablet (100 mg total) by mouth daily. Take one a day    Dispense:  90 tablet    Refill:  0  . escitalopram (LEXAPRO) 10 MG tablet    Sig: Take 1 tablet (10 mg total) by mouth daily.    Dispense:  90 tablet    Refill:  0    Follow-up: Return in about 6 months (around 08/19/2020) for ADHD .    Nani Gasser, MD

## 2020-02-17 NOTE — Assessment & Plan Note (Signed)
Controlled on current regimen.  90-day prescription sent to pharmacy.  Follow-up in 6 months.

## 2020-03-01 ENCOUNTER — Ambulatory Visit: Payer: Managed Care, Other (non HMO) | Admitting: Family Medicine

## 2020-05-18 ENCOUNTER — Other Ambulatory Visit: Payer: Self-pay | Admitting: Family Medicine

## 2020-06-22 ENCOUNTER — Other Ambulatory Visit: Payer: Self-pay | Admitting: Family Medicine

## 2023-01-17 ENCOUNTER — Encounter: Payer: Managed Care, Other (non HMO) | Admitting: Family Medicine

## 2023-02-12 ENCOUNTER — Ambulatory Visit (INDEPENDENT_AMBULATORY_CARE_PROVIDER_SITE_OTHER): Payer: Managed Care, Other (non HMO) | Admitting: Family Medicine

## 2023-02-12 ENCOUNTER — Encounter: Payer: Self-pay | Admitting: Family Medicine

## 2023-02-12 VITALS — BP 109/61 | HR 57 | Ht 71.0 in | Wt 260.0 lb

## 2023-02-12 DIAGNOSIS — Z0000 Encounter for general adult medical examination without abnormal findings: Secondary | ICD-10-CM

## 2023-02-12 DIAGNOSIS — Z1159 Encounter for screening for other viral diseases: Secondary | ICD-10-CM | POA: Diagnosis not present

## 2023-02-12 NOTE — Progress Notes (Signed)
Complete physical exam  Patient: Ryan Gregory   DOB: September 05, 1976   47 y.o. Male  MRN: [id number]  Subjective:    No chief complaint on file.   Ryan Gregory is a 47 y.o. male who presents today for a complete physical exam. He reports consuming a general diet.  Exercises 3 to 4 days a week for 45 minutes to an hour.  Does a combination of cardio and resistance training.  He generally feels well. He reports sleeping fairly well. He does not have additional problems to discuss today.    Most recent fall risk assessment:    02/12/2023    8:53 AM  Fall Risk   Falls in the past year? 0  Number falls in past yr: 0  Injury with Fall? 0  Risk for fall due to : No Fall Risks  Follow up Falls evaluation completed     Most recent depression screenings:    02/12/2023    8:53 AM 02/17/2020    9:12 AM  PHQ 2/9 Scores  PHQ - 2 Score 0 0  PHQ- 9 Score  0         Patient Care Team: Agapito Games, MD as PCP - General   Outpatient Medications Prior to Visit  Medication Sig   amphetamine-dextroamphetamine (ADDERALL XR) 10 MG 24 hr capsule Take 1 capsule (10 mg total) by mouth daily.   [DISCONTINUED] escitalopram (LEXAPRO) 10 MG tablet Take 1 tablet (10 mg total) by mouth daily. YOU MUST SCHEDULE APPOINTMENT WITH DR.AKHTAR FOR YOUR REFILLS.   [DISCONTINUED] lamoTRIgine (LAMICTAL) 100 MG tablet Take 1 tablet (100 mg total) by mouth daily. YOU MUST SCHEDULE APPOINTMENT WITH DR.AKHTAR FOR YOUR REFILLS.   [DISCONTINUED] triamcinolone ointment (KENALOG) 0.5 % Apply 1 application topically 2 (two) times daily.   No facility-administered medications prior to visit.    Maternal grandfather with a history of prostate cancer and colon cancer.  ROS        Objective:     BP 109/61   Pulse (!) 57   Ht 5\' 11"  (1.803 m)   Wt 260 lb (117.9 kg)   SpO2 100%   BMI 36.26 kg/m     Physical Exam Constitutional:      Appearance: He is well-developed.  HENT:     Head:  Normocephalic and atraumatic.     Right Ear: Tympanic membrane, ear canal and external ear normal.     Left Ear: Tympanic membrane, ear canal and external ear normal.     Nose: Nose normal.     Mouth/Throat:     Pharynx: Oropharynx is clear.  Eyes:     Conjunctiva/sclera: Conjunctivae normal.     Pupils: Pupils are equal, round, and reactive to light.  Neck:     Thyroid: No thyromegaly.  Cardiovascular:     Rate and Rhythm: Normal rate and regular rhythm.     Heart sounds: Normal heart sounds.  Pulmonary:     Effort: Pulmonary effort is normal.     Breath sounds: Normal breath sounds.  Abdominal:     General: Bowel sounds are normal. There is no distension.     Palpations: Abdomen is soft. There is no mass.     Tenderness: There is no abdominal tenderness. There is no guarding or rebound.  Musculoskeletal:        General: Normal range of motion.     Cervical back: Normal range of motion and neck supple. No tenderness.  Lymphadenopathy:  Cervical: No cervical adenopathy.  Skin:    General: Skin is warm and dry.  Neurological:     Mental Status: He is alert and oriented to person, place, and time.     Deep Tendon Reflexes: Reflexes are normal and symmetric.  Psychiatric:        Behavior: Behavior normal.        Thought Content: Thought content normal.        Judgment: Judgment normal.      No results found for any visits on 02/12/23.      Assessment & Plan:    Routine Health Maintenance and Physical Exam  Immunization History  Administered Date(s) Administered   Influenza Split 10/19/2012   Influenza Whole 09/09/2007, 07/14/2010, 09/20/2011   Influenza,inj,Quad PF,6+ Mos 10/14/2014, 08/06/2017, 09/05/2018, 07/12/2019   PFIZER(Purple Top)SARS-COV-2 Vaccination 01/07/2020, 02/06/2020   Td 07/17/2003   Tdap 03/27/2014    Health Maintenance  Topic Date Due   Hepatitis C Screening  Never done   COVID-19 Vaccine (3 - 2023-24 season) 02/27/2025 (Originally  06/16/2022)   INFLUENZA VACCINE  05/17/2023   DTaP/Tdap/Td (3 - Td or Tdap) 03/27/2024   HIV Screening  Completed   Pneumococcal Vaccine 51-42 Years old  Aged Out   HPV VACCINES  Aged Out    Discussed health benefits of physical activity, and encouraged him to engage in regular exercise appropriate for his age and condition.  Problem List Items Addressed This Visit   None Visit Diagnoses     Wellness examination    -  Primary   Relevant Orders   Lipid Panel w/reflex Direct LDL   COMPLETE METABOLIC PANEL WITH GFR   Hepatitis C Antibody   Encounter for hepatitis C screening test for low risk patient       Relevant Orders   Lipid Panel w/reflex Direct LDL   COMPLETE METABOLIC PANEL WITH GFR   Hepatitis C Antibody       Keep up a regular exercise program and make sure you are eating a healthy diet Try to eat 4 servings of dairy a day, or if you are lactose intolerant take a calcium with vitamin D daily.  Your vaccines are up to date.  Will turn 47 later this summer so we did discuss colon cancer screening options.  He is most interested in colonoscopy at this point he will let me know this fall.  No follow-ups on file.     Ryan Gasser, MD

## 2023-02-13 LAB — COMPLETE METABOLIC PANEL WITH GFR
AG Ratio: 1.7 (calc) (ref 1.0–2.5)
ALT: 28 U/L (ref 9–46)
AST: 31 U/L (ref 10–40)
Albumin: 4.3 g/dL (ref 3.6–5.1)
Alkaline phosphatase (APISO): 52 U/L (ref 36–130)
BUN: 11 mg/dL (ref 7–25)
CO2: 28 mmol/L (ref 20–32)
Calcium: 9.3 mg/dL (ref 8.6–10.3)
Chloride: 104 mmol/L (ref 98–110)
Creat: 1.01 mg/dL (ref 0.60–1.29)
Globulin: 2.6 g/dL (calc) (ref 1.9–3.7)
Glucose, Bld: 89 mg/dL (ref 65–99)
Potassium: 4.8 mmol/L (ref 3.5–5.3)
Sodium: 139 mmol/L (ref 135–146)
Total Bilirubin: 1 mg/dL (ref 0.2–1.2)
Total Protein: 6.9 g/dL (ref 6.1–8.1)
eGFR: 94 mL/min/{1.73_m2} (ref >=60)

## 2023-02-13 LAB — LIPID PANEL W/REFLEX DIRECT LDL
Cholesterol: 193 mg/dL (ref <200)
HDL: 47 mg/dL (ref >=40)
LDL Cholesterol (Calc): 129 mg/dL (calc) — ABNORMAL HIGH
Non-HDL Cholesterol (Calc): 146 mg/dL (calc) — ABNORMAL HIGH (ref <130)
Total CHOL/HDL Ratio: 4.1 (calc) (ref <5.0)
Triglycerides: 76 mg/dL (ref <150)

## 2023-02-13 LAB — HEPATITIS C ANTIBODY: Hepatitis C Ab: NONREACTIVE

## 2023-02-13 NOTE — Progress Notes (Signed)
Hi Ryan Gregory your LDL went up this year. Work on [employer] and exercise. Other labs normal.

## 2023-09-12 ENCOUNTER — Telehealth: Payer: Self-pay

## 2023-09-12 NOTE — Telephone Encounter (Signed)
This note is blank.  What are we advising on?

## 2023-09-18 NOTE — Telephone Encounter (Signed)
Please respond to the provider with an update. Thanks in advance.

## 2023-09-20 ENCOUNTER — Telehealth: Payer: Self-pay

## 2023-09-20 NOTE — Telephone Encounter (Signed)
Copied from CRM [id number]. Topic: Clinical - Medical Advice >> Sep 12, 2023 10:56 AM Fonda Kinder J wrote: Reason for CRM: Patient wants to know if the solutions for fasting for his upcoming Colonoscopy would be mailed to him or if he could assign someone to pick them up as he would be out of town on the week leading to his appointment

## 2023-09-20 NOTE — Telephone Encounter (Signed)
He would need to call his GI doctor who is actually doing the scope.  I do not know what medicine they gave him and what parameters they recommended for him before his scope.

## 2023-09-21 ENCOUNTER — Telehealth: Payer: Self-pay

## 2023-09-21 DIAGNOSIS — Z1211 Encounter for screening for malignant neoplasm of colon: Secondary | ICD-10-CM

## 2023-09-21 NOTE — Telephone Encounter (Signed)
Please see note from yestedday.  Needs to call GI for instructions

## 2023-09-21 NOTE — Telephone Encounter (Signed)
What location or office? It sayshe has appt for the 9th so will need to date it today.

## 2023-09-21 NOTE — Telephone Encounter (Signed)
E2C2 Agent called office about concerns patient has about his appt 09/24/23 for Colonoscopy, Full scope, patient would like to know if there's any instructions that he needs to do before his appt,please advise, thanks.

## 2023-09-21 NOTE — Telephone Encounter (Signed)
LVM advising pt to contact the GI office and ask what he should do with regards to this.

## 2023-09-21 NOTE — Telephone Encounter (Signed)
Called patient, patient states that he does need referral for GI, Please advise, thanks.

## 2023-09-24 ENCOUNTER — Ambulatory Visit: Payer: Managed Care, Other (non HMO) | Admitting: Family Medicine

## 2023-09-24 NOTE — Telephone Encounter (Signed)
Order placed.  I do not actually perform colonoscopies so those should never be scheduled with me.  Orders Placed This Encounter  Procedures   Ambulatory referral to Gastroenterology    Referral Priority:   Routine    Referral Type:   Consultation    Referral Reason:   Specialty Services Required    Number of Visits Requested:   1

## 2023-09-24 NOTE — Telephone Encounter (Signed)
Referral, clinical notes, demographics and copies of insurance cards have been faxed to [facility] Sharper at [phone]. Office will contact patient to schedule referral appointment.

## 2023-09-24 NOTE — Telephone Encounter (Signed)
Patient was scheduled with Dr. Linford Arnold for Colonscopy on 09/24/23.   Dr. Linford Arnold, can you place referral for GI?

## 2023-09-25 NOTE — Telephone Encounter (Signed)
Patient informed that referral has been sent and that their office will contact him for scheduling.

## 2023-09-28 ENCOUNTER — Ambulatory Visit: Payer: Managed Care, Other (non HMO) | Admitting: Family Medicine

## 2023-09-28 VITALS — BP 122/63 | HR 80 | Ht 71.0 in | Wt 264.0 lb

## 2023-09-28 DIAGNOSIS — F902 Attention-deficit hyperactivity disorder, combined type: Secondary | ICD-10-CM | POA: Diagnosis not present

## 2023-09-28 DIAGNOSIS — Z23 Encounter for immunization: Secondary | ICD-10-CM | POA: Diagnosis not present

## 2023-09-28 MED ORDER — AMPHETAMINE-DEXTROAMPHET ER 10 MG PO CP24: 10.0000 mg | ORAL_CAPSULE | Freq: Every day | ORAL | 0 refills | Status: DC

## 2023-09-28 MED ORDER — AMPHETAMINE-DEXTROAMPHET ER 10 MG PO CP24
10.0000 mg | ORAL_CAPSULE | Freq: Every day | ORAL | 0 refills | Status: AC
Start: 2023-11-27 — End: ?

## 2023-09-28 NOTE — Progress Notes (Signed)
   Established Patient Office Visit  Subjective  Patient ID: Ryan Gregory, male    DOB: 1976-08-10  Age: 47 y.o. MRN: [id number]  Chief Complaint  Patient presents with   Medical Management of Chronic Issues    ADHD    HPI  F/U 6 monthf for ADHD -he like to restart the Adderall 10 mg.  He is doing well overall.  No recent chest pain shortness of breath or palpitations.  Was taking the medication before he did not have any significant concerns or side effects.  Work has been pretty busy.  Sometimes has a hard time falling asleep at night just because his mind will cut a ruminate on the upcoming days but otherwise he is doing okay.      ROS    Objective:     BP 122/63   Pulse 80   Ht 5\' 11"  (1.803 m)   Wt 264 lb (119.7 kg)   SpO2 100%   BMI 36.82 kg/m    Physical Exam Vitals and nursing note reviewed.  Constitutional:      Appearance: Normal appearance.  HENT:     Head: Normocephalic and atraumatic.  Eyes:     Conjunctiva/sclera: Conjunctivae normal.  Cardiovascular:     Rate and Rhythm: Normal rate and regular rhythm.  Pulmonary:     Effort: Pulmonary effort is normal.     Breath sounds: Normal breath sounds.  Skin:    General: Skin is warm and dry.  Neurological:     Mental Status: He is alert.  Psychiatric:        Mood and Affect: Mood normal.      No results found for any visits on 09/28/23.    The 10-year ASCVD risk score (Arnett DK, et al., 2019) is: 3.8%    Assessment & Plan:   Problem List Items Addressed This Visit       Other   Attention deficit hyperactivity disorder (ADHD) - Primary   Relevant Medications   amphetamine-dextroamphetamine (ADDERALL XR) 10 MG 24 hr capsule   amphetamine-dextroamphetamine (ADDERALL XR) 10 MG 24 hr capsule (Start on 10/28/2023)   amphetamine-dextroamphetamine (ADDERALL XR) 10 MG 24 hr capsule (Start on 11/27/2023)   Other Visit Diagnoses       Encounter for immunization       Relevant Orders   Flu  vaccine trivalent PF, 6mos and older(Flulaval,Afluria,Fluarix,Fluzone) (Completed)      Follow up in the spring for wellness exam and updated labs.  Return in about 6 months (around 03/28/2024).    Nani Gasser, MD

## 2023-12-17 LAB — HM COLONOSCOPY

## 2024-03-25 ENCOUNTER — Telehealth: Payer: Self-pay | Admitting: Family Medicine

## 2024-03-25 DIAGNOSIS — F902 Attention-deficit hyperactivity disorder, combined type: Secondary | ICD-10-CM

## 2024-03-25 NOTE — Telephone Encounter (Signed)
 Copied from CRM [id number]. Topic: Clinical - Medication Refill >> Mar 25, 2024  1:24 PM Carrielelia G wrote: Medication: amphetamine -dextroamphetamine (ADDERALL XR) 10 MG 24 hr capsule  Has the patient contacted their pharmacy? No (Agent: If no, request that the patient contact the pharmacy for the refill. If patient does not wish to contact the pharmacy document the reason why and proceed with request.) (Agent: If yes, when and what did the pharmacy advise?)  This is the patient's preferred pharmacy:  [facility] [phone] - [place], [place] - [address] [place] [address] Phone: [phone] Fax: [phone]  Is this the correct pharmacy for this prescription? Yes If no, delete pharmacy and type the correct one.   Has the prescription been filled recently? Yes  Is the patient out of the medication? Yes  Has the patient been seen for an appointment in the last year OR does the patient have an upcoming appointment? Yes  Can we respond through MyChart? Yes  Agent: Please be advised that Rx refills may take up to 3 business days. We ask that you follow-up with your pharmacy.

## 2024-03-28 ENCOUNTER — Ambulatory Visit: Payer: Managed Care, Other (non HMO) | Admitting: Family Medicine

## 2024-03-31 MED ORDER — AMPHETAMINE-DEXTROAMPHET ER 10 MG PO CP24: 10.0000 mg | ORAL_CAPSULE | Freq: Every day | ORAL | 0 refills | Status: DC

## 2024-04-17 ENCOUNTER — Ambulatory Visit: Admitting: Family Medicine

## 2024-09-29 ENCOUNTER — Encounter: Payer: Self-pay | Admitting: Family Medicine

## 2024-09-29 ENCOUNTER — Ambulatory Visit: Admitting: Family Medicine

## 2024-09-29 VITALS — BP 123/75 | HR 74 | Ht 71.0 in | Wt 260.0 lb

## 2024-09-29 DIAGNOSIS — Z23 Encounter for immunization: Secondary | ICD-10-CM | POA: Diagnosis not present

## 2024-09-29 DIAGNOSIS — Z0000 Encounter for general adult medical examination without abnormal findings: Secondary | ICD-10-CM

## 2024-09-29 DIAGNOSIS — F902 Attention-deficit hyperactivity disorder, combined type: Secondary | ICD-10-CM

## 2024-09-29 MED ORDER — AMPHETAMINE-DEXTROAMPHET ER 10 MG PO CP24: 10.0000 mg | ORAL_CAPSULE | Freq: Every day | ORAL | 0 refills | Status: AC

## 2024-09-29 NOTE — Progress Notes (Signed)
 Complete physical exam  Patient: Ryan Gregory    DOB: 01/11/1976 48 y.o.   MRN: [id number]  Chief Complaint  Patient presents with   Annual Exam    Subjective:    Ryan Gregory is a 48 y.o. male who presents today for a complete physical exam. He reports consuming a general diet. Wokrs our 4-5 day per week.  He generally feels well. He reports sleeping fairly well. He does not have additional problems to discuss today.   Discussed the use of AI scribe software for clinical note transcription with the patient, who gave verbal consent to proceed.  History of Present Illness Ryan Gregory is a 48 year old male who presents for a routine follow-up visit.  Sleep disturbances - Occasional nocturnal awakenings and restlessness - Overall sleep quality described as satisfactory  Exercise tolerance - Engages in regular physical activity four to five days per week - Adjusts exercise routine based on weather conditions - No chest pain or dyspnea during exercise - Mild breathlessness only when exerting himself intensely  Attention deficit hyperactivity disorder (adhd) management - Continues Adderall 10 mg daily - No changes in medication dosage  Colorectal cancer screening and family history - Family history of colon cancer and prostate cancer in grandfather - Father has history of benign colonic polyps - Personal history of colonic polyps found on previous screening, not considered concerning - Uncertain about recommended follow-up interval, recalls possible ten-year interval  Recent mild illness - Experienced mild illness around Thanksgiving, likely contracted from children - Recovered without significant complications   Most recent fall risk assessment:    09/29/2024    7:19 AM  Fall Risk   Falls in the past year? 0  Number falls in past yr: 0  Injury with Fall? 0  Risk for fall due to : No Fall Risks  Follow up Falls evaluation completed     Most recent  depression screenings:    09/29/2024    7:19 AM 02/12/2023    8:53 AM  PHQ 2/9 Scores  PHQ - 2 Score 0 0        Patient Care Team: Alvan Dorothyann BIRCH, MD as PCP - General   ROS    Objective:    BP 123/75   Pulse 74   Ht 5' 11 (1.803 m)   Wt 260 lb (117.9 kg)   SpO2 98%   BMI 36.26 kg/m     Physical Exam Constitutional:      Appearance: Normal appearance.  HENT:     Head: Normocephalic and atraumatic.     Right Ear: Tympanic membrane, ear canal and external ear normal.     Left Ear: Tympanic membrane, ear canal and external ear normal.     Nose: Nose normal.     Mouth/Throat:     Pharynx: Oropharynx is clear.  Eyes:     Extraocular Movements: Extraocular movements intact.     Conjunctiva/sclera: Conjunctivae normal.     Pupils: Pupils are equal, round, and reactive to light.  Neck:     Thyroid: No thyromegaly.  Cardiovascular:     Rate and Rhythm: Normal rate and regular rhythm.  Pulmonary:     Effort: Pulmonary effort is normal.     Breath sounds: Normal breath sounds.  Abdominal:     General: Bowel sounds are normal.     Palpations: Abdomen is soft.     Tenderness: There is no abdominal tenderness.  Musculoskeletal:  General: No swelling.     Cervical back: Neck supple.  Skin:    General: Skin is warm and dry.  Neurological:     Mental Status: He is oriented to person, place, and time.  Psychiatric:        Mood and Affect: Mood normal.        Behavior: Behavior normal.       No results found for any visits on 09/29/24.       Assessment & Plan:    Routine Health Maintenance and Physical Exam Immunization History  Administered Date(s) Administered   Influenza Split 10/19/2012   Influenza Whole 09/09/2007, 07/14/2010, 09/20/2011   Influenza, Seasonal, Injecte, Preservative Fre 09/28/2023, 09/29/2024   Influenza,inj,Quad PF,6+ Mos 10/14/2014, 08/06/2017, 09/05/2018, 07/12/2019   PFIZER(Purple Top)SARS-COV-2 Vaccination 01/07/2020,  02/06/2020   Td 07/17/2003   Tdap 03/27/2014, 09/29/2024    Health Maintenance  Topic Date Due   Hepatitis B Vaccines 19-59 Average Risk (1 of 3 - 19+ 3-dose series) Never done   COVID-19 Vaccine (3 - 2025-26 season) 06/16/2024   Colonoscopy  12/16/2033   DTaP/Tdap/Td (4 - Td or Tdap) 09/29/2034   Influenza Vaccine  Completed   Hepatitis C Screening  Completed   HIV Screening  Completed   Pneumococcal Vaccine  Aged Out   HPV VACCINES  Aged Out   Meningococcal B Vaccine  Aged Out    Discussed health benefits of physical activity, and encouraged him to engage in regular exercise appropriate for his age and condition.  Problem List Items Addressed This Visit       Other   Attention deficit hyperactivity disorder (ADHD)   Relevant Medications   amphetamine -dextroamphetamine (ADDERALL XR) 10 MG 24 hr capsule   amphetamine -dextroamphetamine (ADDERALL XR) 10 MG 24 hr capsule (Start on 10/27/2024)   amphetamine -dextroamphetamine (ADDERALL XR) 10 MG 24 hr capsule (Start on 11/24/2024)   Other Visit Diagnoses       Encounter for immunization    -  Primary   Relevant Orders   Flu vaccine trivalent PF, 6mos and older(Flulaval,Afluria,Fluarix,Fluzone) (Completed)   Tdap vaccine greater than or equal to 7yo IM (Completed)     Wellness examination       Relevant Orders   CMP14+EGFR   Lipid Panel With LDL/HDL Ratio   CBC with Differential/Platelet   PSA       Assessment and Plan Assessment & Plan Attention deficit hyperactivity disorder (ADHD), combined type ADHD well-managed with Adderall XR 10 mg daily. No chest pain or significant dyspnea. Mood stable. - Continue amphetamine -dextroamphetamine (Adderall XR) 10 mg daily.  General Health Maintenance Colonoscopy results current with benign polyps. Family history of colon and prostate cancer noted. Discussed colonoscopy follow-up factors and advised high fiber diet. - Ordered blood work to assess cholesterol levels. - Maintain high  fiber diet to reduce colon cancer risk.    Return in about 6 months (around 03/30/2025) for ADD meds.    Dorothyann Byars, MD [facility] at [facility]

## 2024-09-30 ENCOUNTER — Ambulatory Visit: Payer: Self-pay | Admitting: Family Medicine

## 2024-09-30 LAB — CBC WITH DIFFERENTIAL/PLATELET
Basophils Absolute: 0 x10E3/uL (ref 0.0–0.2)
Basos: 1 %
EOS (ABSOLUTE): 0.1 x10E3/uL (ref 0.0–0.4)
Eos: 2 %
Hematocrit: 46.9 % (ref 37.5–51.0)
Hemoglobin: 15.5 g/dL (ref 13.0–17.7)
Immature Grans (Abs): 0 x10E3/uL (ref 0.0–0.1)
Immature Granulocytes: 0 %
Lymphocytes Absolute: 2.7 x10E3/uL (ref 0.7–3.1)
Lymphs: 47 %
MCH: 30.5 pg (ref 26.6–33.0)
MCHC: 33 g/dL (ref 31.5–35.7)
MCV: 92 fL (ref 79–97)
Monocytes Absolute: 0.5 x10E3/uL (ref 0.1–0.9)
Monocytes: 9 %
Neutrophils Absolute: 2.2 x10E3/uL (ref 1.4–7.0)
Neutrophils: 41 %
Platelets: 307 x10E3/uL (ref 150–450)
RBC: 5.08 x10E6/uL (ref 4.14–5.80)
RDW: 14.3 % (ref 11.6–15.4)
WBC: 5.5 x10E3/uL (ref 3.4–10.8)

## 2024-09-30 LAB — CMP14+EGFR
ALT: 17 IU/L (ref 0–44)
AST: 19 IU/L (ref 0–40)
Albumin: 4.3 g/dL (ref 4.1–5.1)
Alkaline Phosphatase: 70 IU/L (ref 47–123)
BUN/Creatinine Ratio: 14 (ref 9–20)
BUN: 15 mg/dL (ref 6–24)
Bilirubin Total: 0.6 mg/dL (ref 0.0–1.2)
CO2: 22 mmol/L (ref 20–29)
Calcium: 9.4 mg/dL (ref 8.7–10.2)
Chloride: 102 mmol/L (ref 96–106)
Creatinine, Ser: 1.11 mg/dL (ref 0.76–1.27)
Globulin, Total: 2.8 g/dL (ref 1.5–4.5)
Glucose: 95 mg/dL (ref 70–99)
Potassium: 4.8 mmol/L (ref 3.5–5.2)
Sodium: 137 mmol/L (ref 134–144)
Total Protein: 7.1 g/dL (ref 6.0–8.5)
eGFR: 83 mL/min/1.73 (ref >59)

## 2024-09-30 LAB — LIPID PANEL WITH LDL/HDL RATIO
Cholesterol, Total: 211 mg/dL — ABNORMAL HIGH (ref 100–199)
HDL: 52 mg/dL (ref >39)
LDL Chol Calc (NIH): 142 mg/dL — ABNORMAL HIGH (ref 0–99)
LDL/HDL Ratio: 2.7 ratio (ref 0.0–3.6)
Triglycerides: 95 mg/dL (ref 0–149)
VLDL Cholesterol Cal: 17 mg/dL (ref 5–40)

## 2024-09-30 LAB — PSA: Prostate Specific Ag, Serum: 0.4 ng/mL (ref 0.0–4.0)

## 2024-09-30 NOTE — Progress Notes (Signed)
 Hi Ryan Gregory, total cholesterol and LDL are elevated just encouraged to continue to work on healthy diet and regular exercise.  I recommend a Mediterranean diet and shooting for 30 minutes of exercise 5 days a week.  You have been exercising pretty regularly which is great.  Metabolic panel and blood count look great.  Prostate test is normal.  The 10-year ASCVD risk score (Arnett DK, et al., 2019) is: 4.1%   Values used to calculate the score:     Age: 48 years     Clinically relevant sex: Male     Is Non-Hispanic African American: Yes     Diabetic: No     Tobacco smoker: No     Systolic Blood Pressure: 123 mmHg     Is BP treated: No     HDL Cholesterol: 52 mg/dL     Total Cholesterol: 211 mg/dL

## 2024-10-07 ENCOUNTER — Encounter: Admitting: Family Medicine

## 2025-03-30 ENCOUNTER — Ambulatory Visit: Admitting: Family Medicine
